# Patient Record
Sex: Female | Born: 1960 | Race: Black or African American | Hispanic: No | Marital: Single | Smoking: Never smoker
Health system: Southern US, Community
[De-identification: ages and names within clinical notes are randomized; demographics above are authoritative.]

## PROBLEM LIST (undated history)

## (undated) DIAGNOSIS — N289 Disorder of kidney and ureter, unspecified: Secondary | ICD-10-CM

## (undated) DIAGNOSIS — F329 Major depressive disorder, single episode, unspecified: Secondary | ICD-10-CM

## (undated) DIAGNOSIS — H539 Unspecified visual disturbance: Secondary | ICD-10-CM

## (undated) DIAGNOSIS — G35 Multiple sclerosis: Secondary | ICD-10-CM

## (undated) DIAGNOSIS — I10 Essential (primary) hypertension: Secondary | ICD-10-CM

## (undated) DIAGNOSIS — E785 Hyperlipidemia, unspecified: Secondary | ICD-10-CM

## (undated) DIAGNOSIS — R51 Headache: Secondary | ICD-10-CM

## (undated) DIAGNOSIS — F32A Depression, unspecified: Secondary | ICD-10-CM

## (undated) DIAGNOSIS — R519 Headache, unspecified: Secondary | ICD-10-CM

## (undated) HISTORY — DX: Major depressive disorder, single episode, unspecified: F32.9

## (undated) HISTORY — DX: Multiple sclerosis: G35

## (undated) HISTORY — DX: Hyperlipidemia, unspecified: E78.5

## (undated) HISTORY — DX: Headache, unspecified: R51.9

## (undated) HISTORY — DX: Unspecified visual disturbance: H53.9

## (undated) HISTORY — DX: Disorder of kidney and ureter, unspecified: N28.9

## (undated) HISTORY — DX: Essential (primary) hypertension: I10

## (undated) HISTORY — DX: Headache: R51

## (undated) HISTORY — DX: Depression, unspecified: F32.A

---

## 1998-10-24 ENCOUNTER — Emergency Department (HOSPITAL_COMMUNITY): Admission: EM | Admit: 1998-10-24 | Discharge: 1998-10-24 | Payer: Self-pay | Admitting: Emergency Medicine

## 1998-11-01 ENCOUNTER — Ambulatory Visit (HOSPITAL_COMMUNITY): Admission: RE | Admit: 1998-11-01 | Discharge: 1998-11-01 | Payer: Self-pay | Admitting: Family Medicine

## 1998-11-01 ENCOUNTER — Encounter: Payer: Self-pay | Admitting: Family Medicine

## 2000-03-07 ENCOUNTER — Other Ambulatory Visit: Admission: RE | Admit: 2000-03-07 | Discharge: 2000-03-07 | Payer: Self-pay | Admitting: *Deleted

## 2001-08-11 ENCOUNTER — Other Ambulatory Visit: Admission: RE | Admit: 2001-08-11 | Discharge: 2001-08-11 | Payer: Self-pay | Admitting: Obstetrics and Gynecology

## 2001-08-21 ENCOUNTER — Encounter: Payer: Self-pay | Admitting: Obstetrics and Gynecology

## 2001-08-21 ENCOUNTER — Ambulatory Visit (HOSPITAL_COMMUNITY): Admission: RE | Admit: 2001-08-21 | Discharge: 2001-08-21 | Payer: Self-pay | Admitting: Obstetrics and Gynecology

## 2002-08-17 ENCOUNTER — Other Ambulatory Visit: Admission: RE | Admit: 2002-08-17 | Discharge: 2002-08-17 | Payer: Self-pay | Admitting: Obstetrics and Gynecology

## 2002-08-26 ENCOUNTER — Ambulatory Visit (HOSPITAL_COMMUNITY): Admission: RE | Admit: 2002-08-26 | Discharge: 2002-08-26 | Payer: Self-pay | Admitting: Obstetrics and Gynecology

## 2002-08-26 ENCOUNTER — Encounter: Payer: Self-pay | Admitting: Obstetrics and Gynecology

## 2003-11-02 ENCOUNTER — Other Ambulatory Visit: Admission: RE | Admit: 2003-11-02 | Discharge: 2003-11-02 | Payer: Self-pay | Admitting: Obstetrics and Gynecology

## 2003-11-09 ENCOUNTER — Ambulatory Visit (HOSPITAL_COMMUNITY): Admission: RE | Admit: 2003-11-09 | Discharge: 2003-11-09 | Payer: Self-pay | Admitting: Family Medicine

## 2005-05-07 ENCOUNTER — Other Ambulatory Visit: Admission: RE | Admit: 2005-05-07 | Discharge: 2005-05-07 | Payer: Self-pay | Admitting: Obstetrics and Gynecology

## 2005-05-22 ENCOUNTER — Ambulatory Visit (HOSPITAL_COMMUNITY): Admission: RE | Admit: 2005-05-22 | Discharge: 2005-05-22 | Payer: Self-pay | Admitting: Obstetrics and Gynecology

## 2006-06-03 ENCOUNTER — Other Ambulatory Visit: Admission: RE | Admit: 2006-06-03 | Discharge: 2006-06-03 | Payer: Self-pay | Admitting: Obstetrics and Gynecology

## 2006-07-01 ENCOUNTER — Ambulatory Visit (HOSPITAL_COMMUNITY): Admission: RE | Admit: 2006-07-01 | Discharge: 2006-07-01 | Payer: Self-pay | Admitting: Obstetrics and Gynecology

## 2006-07-05 ENCOUNTER — Ambulatory Visit: Payer: Self-pay | Admitting: Internal Medicine

## 2006-07-18 ENCOUNTER — Ambulatory Visit: Payer: Self-pay | Admitting: Internal Medicine

## 2006-07-19 ENCOUNTER — Ambulatory Visit: Payer: Self-pay | Admitting: Internal Medicine

## 2006-09-09 ENCOUNTER — Ambulatory Visit: Payer: Self-pay | Admitting: Internal Medicine

## 2009-12-24 DIAGNOSIS — G35 Multiple sclerosis: Secondary | ICD-10-CM

## 2009-12-24 HISTORY — DX: Multiple sclerosis: G35

## 2009-12-25 ENCOUNTER — Ambulatory Visit (HOSPITAL_COMMUNITY): Admission: RE | Admit: 2009-12-25 | Discharge: 2009-12-25 | Payer: Self-pay | Admitting: Family Medicine

## 2011-05-11 NOTE — Assessment & Plan Note (Signed)
[facility]                               PULMONARY OFFICE NOTE   FINLAY, MILLS                        MRN:          [id number]  DATE:07/05/2006                            DOB:          [birth year]    CHIEF COMPLAINT:  Cough.   HISTORY OF PRESENT ILLNESS:  This is a 50 year old, black female, never a  smoker with a history of mild nasal congestion with tendency to sinus  headaches for which Zyrtec has helped some in the past.  She has been  acutely ill since May 16, with the onset of sore throat, fever, chest  discomfort during coughing and generalized headache.  She was treated with  antibiotics, prednisone and cough suppressants and still had the sensation  of something still stuck in her throat associated with an urge to clear  her throat, but no excessive sputum production.  She has nasal congestion,  but no purulent nasal secretions, itching, sneezing or overt reflux  symptoms.  She initially had one episode of vomiting during severe coughing  paroxysms and now has urinary urgency, but no incontinence during coughs.  She says the only thing that seems to help her cough now is heavy cough  suppressants which just put her to sleep.  She said she is maybe a little  better from Advair.   PAST MEDICAL HISTORY:  1.  Hypertension for which she is not on ACE inhibitors.  2.  Hyperlipidemia.  3.  Morbid obesity.   ALLERGIES:  NO KNOWN DRUG ALLERGIES.   MEDICATIONS:  Recorded in detail on July 05, 2006.   SOCIAL HISTORY:  She has never smoked.  She works for a group home.   FAMILY HISTORY:  Significant for allergies and asthma, negative for other  respiratory diseases.   REVIEW OF SYSTEMS:  Taken in detail and significant for the above problems.   PHYSICAL EXAMINATION:  GENERAL:  This is a somber, but no overtly depressed,  slightly anxious, black female in acute distress.  VITAL SIGNS:  Blood pressure 160/100, weight 247 pounds.  HEENT:   Severe edema to the point that she has total obstruction of both  nasal passages.  There are no purulent secretions or polyps noted.  Oropharynx is clear with no excessive post nasal drainage.  NECK:  Supple without cervical adenopathy.  No lymphadenopathy.  LUNGS:  Clear to auscultation.  CARDIAC:  Regular rhythm without murmurs, rubs or gallops.  ABDOMEN:  Soft, benign with no palpable organomegaly or masses and  tenderness.  EXTREMITIES:  Warm without calf tenderness, cyanosis or clubbing.   LABORATORY DATA AND X-RAY FINDINGS:  Chest x-ray report reviewed from  [facility] dated June 14, 2006, is normal except for mild cardiomegaly.   IMPRESSION:  Severe upper airway coughing in a setting of recent upper  respiratory infection with marginal response to Advair and best response to  cough suppressants.  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  Note,  the absence of response to prednisone strongly rules against an allergic  mechanism.   RECOMMENDATIONS:  1.  Eliminate the cough inducing cough cycle associated with reflux by      asking her to take Protonix preferably before meals for the next 2 weeks      and also a reflux diet.  2.  Address the underlying problem of chronic rhinitis with nasal steroids      in the form of nasal spray two puffs b.i.d. with Afrin for the first 5      days only.  3.  To eliminate cyclical coughing, we recommended Delsym supplemented with      Tramadol 50 mg one every 4 hours and went over the physiology of both      rhinitis induced cough and the central role reflux typically plays in      the syndrome to help her understand that this is a complex area of      medicine where there may not be one identifiable cause driving the      cough.  4.  If she is not better by the time she returns in 2 weeks, I  would to do a      sinus CT scan.  5.  At this point, I think Advair, although it may have helped somewhat with      the lower airway, seems to be irritating the upper airway and I am going      to ask her to stop it.                                   Charlaine Dalton. Sherene Sires, MD, [facility]   MBW/MedQ  DD:  07/05/2006  DT:  07/05/2006  Job #:  [id number]   cc:   Deatra James, MD

## 2011-05-11 NOTE — Assessment & Plan Note (Signed)
[facility]                               PULMONARY OFFICE NOTE   Joanne, Parker                        MRN:          [id number]  DATE:07/19/2006                            DOB:          [birth year]    HISTORY:  A 50 year old black female with refractory cough dating back to  mid-May 2007, somewhat better (she ranks it as maybe 40-50%) after  treatment directed at reflux with Protonix 40 mg q.a.m. and also at rhinitis  with Nasacort 2 puffs b.i.d.  I asked her stop Advair because I thought it  might be irritating her airway more than helping the lower airway as well.  She produces minimal mucoid sputum.  Denies any nocturnal exacerbations at  this point or significant dyspnea, chest pain, overt rhinitis, or reflux  symptoms.   For full inventory of medications, please see face sheet dated July 19, 2006, noting the patient is on Ziac at a dose of 10 mg b.i.d.   PHYSICAL EXAMINATION:  GENERAL:  She is a pleasant, ambulatory, black female  in no acute distress.  VITAL SIGNS:  Blood pressure 148/86.  HEENT:  Unremarkable.  Pharynx clear.  There is no excessive post nasal  drainage, cobblestoning.  NECK:  Supple without cervical adenopathy or tenderness.  Trachea is  midline.  LUNGS:  Fields are perfectly clear bilaterally on auscultation percussion  with no cough elicited on inspiratory/expiratory maneuvers.  HEART:  There is a regular rate and rhythm without murmur, gallop or rub  present.  ABDOMEN:  Soft, benign.  EXTREMITIES:  Warm without calf tenderness, cyanosis, clubbing, or edema.   Sinus CT scan was reviewed with the patient from July 18, 2006, and is  normal.   IMPRESSION:  Severe upper airway coughing in the setting of apparent upper  respiratory infection in May with no convincing response to Advair and  actually better since she stopped it while being treated for rhinitis and  reflux.   The fact that the turbinates are so  normal on CT scan leads me away from a  diagnosis of rhinitis being the mechanism for cough and toward the  possibility that this is all reflux, especially given the patient's body  habitus.   She also may have a component of asthma and is on very high doses of beta-  blockers which could be problematic in terms of controlling the cough.   1.  therefore, recommend the following strategy:      1.  Try reducing the dose of Ziac to 10 mg q.a.m. and adding Sular 20 mg          every day to her regimen (__________  worsening reflux symptoms          since it is a calcium channel blocker and I would have a low          threshold to switch back to Ziac or add a ARB to Ziac to reduce the          beta-blocker component for antihypertensive therapy).      2.  Continue to use Nasacort consistently.      3.  Increase Protonix empirically to 4 mg b.i.d.      4.  Suppress cyclical coughing with a combination of Delsym 2 teaspoons          q.12 h. plus Tramadol 50 mg q.4 h. (She misunderstood the          instructions the first time.  So, I spent extra time with her.  In          fact 15-25 minute visit, going line by line over the recommendation          to make sure she understood both maintenance and p.r.n.          recommendations).   I am encouraged that she is at least 40-50% better after reporting no  improvement previously and hope that we can arrive at an empiric diagnosis  without additional studies.  However, a methacholine challenge test may need  to be considered if she continues to have refractory cough as well as  perhaps a trial of pro motility therapy in the form of Reglan.                                   Charlaine Dalton. Sherene Sires, MD, [facility]   MBW/MedQ  DD:  07/19/2006  DT:  07/20/2006  Job #:  [id number]   cc:   Stacie Acres. Cliffton Asters, MD

## 2011-05-11 NOTE — Assessment & Plan Note (Signed)
[facility]                               PULMONARY OFFICE NOTE   [name], [name]                        MRN:          [id number]  DATE:09/09/2006                            DOB:          02/23/1961    HISTORY:  A 50 year old black female with chronic cough that seemed better  while being treated aggressively for rhinitis and reflux, and comes back  today for refills after missing her followup appointment on August 22.  She  denies any exacerbation of coughing or need for cough suppressants over the  last several weeks.   PHYSICAL EXAMINATION:  She is a pleasant ambulatory black female in no acute  distress.  She weighs 238 pounds, which is no change in baseline.  Otherwise, vital signs are unremarkable.  Her blood pressure is 126/80.  HEENT:  Unremarkable, oropharynx is clear.  LUNGS:  Lung fields are clear bilaterally to auscultation and percussion.  CARDIAC:  Appears regular rhythm without murmur, gallop or rub.  ABDOMEN:  Soft, benign.  EXTREMITIES:  Warm without calf tenderness, cyanosis, clubbing or edema.   IMPRESSION:  Chronic cough, seems better with treatment directed at rhinitis  and reflux.  The reason I like to treat reflux aggressively is it is the  only cause of chronic cough that actually causes the other two major causes  (asthma and rhinitis).  Using this strategy, I recommended she try weaning  the Nasacort first, and then reducing the Protonix dose down to 1 daily.  If  at any point she exacerbates, she can certainly go back to treating both  rhinitis and reflux at the higher doses.   I reminded her that dietary measures are probably just as important as  taking medications, and that refills could be obtained through Dr. Cala Bradford offices.  I will need to see her if her cough is not adequately  controlled on the above regimen.                                   Joanne Parker. Joanne Sires, MD, FCCP   MBW/MedQ  DD:   09/10/2006  DT:  09/11/2006  Job #:  [id number]   cc:   Stacie Acres. Cliffton Asters, M.D.

## 2012-11-22 ENCOUNTER — Ambulatory Visit (INDEPENDENT_AMBULATORY_CARE_PROVIDER_SITE_OTHER): Payer: PRIVATE HEALTH INSURANCE | Admitting: Emergency Medicine

## 2012-11-22 VITALS — BP 145/81 | HR 77 | Temp 98.3°F | Resp 18 | Ht 64.0 in | Wt 234.0 lb

## 2012-11-22 DIAGNOSIS — I10 Essential (primary) hypertension: Secondary | ICD-10-CM

## 2012-11-22 DIAGNOSIS — E782 Mixed hyperlipidemia: Secondary | ICD-10-CM

## 2012-11-22 DIAGNOSIS — J069 Acute upper respiratory infection, unspecified: Secondary | ICD-10-CM

## 2012-11-22 MED ORDER — LISINOPRIL 10 MG PO TABS: 10.0000 mg | ORAL_TABLET | Freq: Every day | ORAL | Status: DC

## 2012-11-22 MED ORDER — CITALOPRAM HYDROBROMIDE 20 MG PO TABS: 20.0000 mg | ORAL_TABLET | Freq: Every day | ORAL | Status: DC

## 2012-11-22 MED ORDER — PSEUDOEPHEDRINE-GUAIFENESIN ER 60-600 MG PO TB12: 1.0000 | ORAL_TABLET | Freq: Two times a day (BID) | ORAL | Status: DC

## 2012-11-22 MED ORDER — PRAVASTATIN SODIUM 40 MG PO TABS: 40.0000 mg | ORAL_TABLET | Freq: Every day | ORAL | Status: DC

## 2012-11-22 MED ORDER — BISOPROLOL-HYDROCHLOROTHIAZIDE 10-6.25 MG PO TABS: 2.0000 | ORAL_TABLET | Freq: Every day | ORAL | Status: AC

## 2012-11-22 NOTE — Progress Notes (Signed)
[facility] [address], [place] [address] [phone]  Date:  11/22/2012   Name:  Joanne Parker   DOB:  05/10/1961   MRN:  [id number]  PCP:  No primary provider on file.    Chief Complaint: sinus congestion, Cough and Fatigue   History of Present Illness:  Joanne Parker is a 51 y.o. very pleasant female patient who presents with the following:  Nasal congestion and drainage that is clear mucoid.  Has moderate post nasal drainage.  Non productive cough.  No wheezing or shortness of breath.  No sore throat.  No fever or chills.  Says that she feels hot and that is related to her MS.  Denies nausea or vomiting.  Needs refills on meds.  No labs since over a year ago.  There is no problem list on file for this patient.   Past Medical History  Diagnosis Date  . Depression   . Hypertension   . MS (multiple sclerosis) 2011    History reviewed. No pertinent past surgical history.  History  Substance Use Topics  . Smoking status: Never Smoker   . Smokeless tobacco: Never Used  . Alcohol Use: No    Family History  Problem Relation Age of Onset  . Hypertension Mother   . Hypertension Father   . Stroke Father   . Heart disease Maternal Grandmother   . Cancer Maternal Grandfather   . Heart disease Paternal Grandmother   . Asthma Paternal Grandfather     No Known Allergies  Medication list has been reviewed and updated.  Current Outpatient Prescriptions on File Prior to Visit  Medication Sig Dispense Refill  . citalopram (CELEXA) 20 MG tablet Take 20 mg by mouth daily.      . interferon beta-1a (REBIF REBIDOSE) 44 MCG/0.5ML injection Inject 44 mcg into the skin 3 (three) times a week.      Marland Kitchen lisinopril (PRINIVIL,ZESTRIL) 10 MG tablet Take 10 mg by mouth daily. Takes 2 tabs daily      . pravastatin (PRAVACHOL) 40 MG tablet Take 40 mg by mouth daily. Takes two tablets daily        Review of Systems:  As per HPI, otherwise negative.     Physical Examination: Filed Vitals:   11/22/12 1722  BP: 145/81  Pulse: 77  Temp: 98.3 F (36.8 C)  Resp: 18   Filed Vitals:   11/22/12 1722  Height: 5\' 4"  (1.626 m)  Weight: 234 lb (106.142 kg)   Body mass index is 40.17 kg/(m^2). Ideal Body Weight: Weight in (lb) to have BMI = 25: 145.3   GEN: WDWN, NAD, Non-toxic, A & O x 3 HEENT: Atraumatic, Normocephalic. Neck supple. No masses, No LAD. Ears and Nose: No external deformity. CV: RRR, No M/G/R. No JVD. No thrill. No extra heart sounds. PULM: CTA B, no wheezes, crackles, rhonchi. No retractions. No resp. distress. No accessory muscle use. ABD: S, NT, ND, +BS. No rebound. No HSM. EXTR: No c/c/e NEURO Normal gait.  PSYCH: Normally interactive. Conversant. Not depressed or anxious appearing.  Calm demeanor.    Assessment and Plan: Reviewed old records Scheduled labs Schedule in 104 with Dr Audria Nine URI mucinex D Refills for a month  Carmelina Dane, MD

## 2012-11-24 ENCOUNTER — Other Ambulatory Visit: Payer: Self-pay | Admitting: *Deleted

## 2012-11-24 MED ORDER — PRAVASTATIN SODIUM 40 MG PO TABS: 40.0000 mg | ORAL_TABLET | Freq: Every day | ORAL | Status: DC

## 2012-11-24 MED ORDER — LISINOPRIL 10 MG PO TABS: 10.0000 mg | ORAL_TABLET | Freq: Every day | ORAL | Status: DC

## 2012-12-01 NOTE — Progress Notes (Signed)
Reviewed and agree.

## 2013-01-01 ENCOUNTER — Ambulatory Visit (INDEPENDENT_AMBULATORY_CARE_PROVIDER_SITE_OTHER): Payer: PRIVATE HEALTH INSURANCE | Admitting: Physician Assistant

## 2013-01-01 VITALS — BP 118/67 | HR 74 | Temp 98.9°F | Resp 16 | Ht 62.5 in | Wt 229.0 lb

## 2013-01-01 DIAGNOSIS — E785 Hyperlipidemia, unspecified: Secondary | ICD-10-CM | POA: Insufficient documentation

## 2013-01-01 DIAGNOSIS — J101 Influenza due to other identified influenza virus with other respiratory manifestations: Secondary | ICD-10-CM

## 2013-01-01 DIAGNOSIS — F329 Major depressive disorder, single episode, unspecified: Secondary | ICD-10-CM | POA: Insufficient documentation

## 2013-01-01 DIAGNOSIS — G35 Multiple sclerosis: Secondary | ICD-10-CM | POA: Insufficient documentation

## 2013-01-01 DIAGNOSIS — F32A Depression, unspecified: Secondary | ICD-10-CM | POA: Insufficient documentation

## 2013-01-01 DIAGNOSIS — I10 Essential (primary) hypertension: Secondary | ICD-10-CM | POA: Insufficient documentation

## 2013-01-01 DIAGNOSIS — R059 Cough, unspecified: Secondary | ICD-10-CM

## 2013-01-01 DIAGNOSIS — J111 Influenza due to unidentified influenza virus with other respiratory manifestations: Secondary | ICD-10-CM

## 2013-01-01 DIAGNOSIS — R51 Headache: Secondary | ICD-10-CM

## 2013-01-01 DIAGNOSIS — R519 Headache, unspecified: Secondary | ICD-10-CM

## 2013-01-01 DIAGNOSIS — R05 Cough: Secondary | ICD-10-CM

## 2013-01-01 LAB — POCT INFLUENZA A/B
Influenza A, POC: POSITIVE
Influenza B, POC: NEGATIVE

## 2013-01-01 MED ORDER — HYDROCOD POLST-CHLORPHEN POLST 10-8 MG/5ML PO LQCR: 5.0000 mL | Freq: Two times a day (BID) | ORAL | Status: DC | PRN

## 2013-01-01 MED ORDER — IPRATROPIUM BROMIDE 0.03 % NA SOLN: 2.0000 | Freq: Two times a day (BID) | NASAL | Status: DC

## 2013-01-01 MED ORDER — OSELTAMIVIR PHOSPHATE 75 MG PO CAPS: 75.0000 mg | ORAL_CAPSULE | Freq: Two times a day (BID) | ORAL | Status: DC

## 2013-01-01 MED ORDER — GUAIFENESIN ER 1200 MG PO TB12: 1.0000 | ORAL_TABLET | Freq: Two times a day (BID) | ORAL | Status: DC | PRN

## 2013-01-01 NOTE — Progress Notes (Signed)
Subjective:    Patient ID: Joanne Parker, female    DOB: 1961-05-28, 52 y.o.   MRN: [id number]  HPI This 52 y.o. female presents for evaluation of illness x 2 days.  Symptoms began with HA, then developed cough which kept her awake last night.  Used cough drops all day today.  No achiness.  Sore throat.  No ear pain.  No GU symptoms.  No GI symptoms. No flu vaccine this season.  Past Medical History  Diagnosis Date  . Depression   . Hypertension   . MS (multiple sclerosis) 2011  . Hyperlipidemia     History reviewed. No pertinent past surgical history.  Prior to Admission medications   Medication Sig Start Date End Date Taking? Authorizing Provider  bisoprolol-hydrochlorothiazide (ZIAC) 10-6.25 MG per tablet Take 2 tablets by mouth daily. 11/22/12  Yes Phillips Odor, MD  citalopram (CELEXA) 20 MG tablet Take 1 tablet (20 mg total) by mouth daily. 11/22/12  Yes Phillips Odor, MD  interferon beta-1a (REBIF REBIDOSE) 44 MCG/0.5ML injection Inject 44 mcg into the skin 3 (three) times a week.   Yes Historical Provider, MD  lisinopril (PRINIVIL,ZESTRIL) 10 MG tablet Take 1 tablet (10 mg total) by mouth daily. 11/24/12  Yes Phillips Odor, MD  pravastatin (PRAVACHOL) 40 MG tablet Take 1 tablet (40 mg total) by mouth daily. 11/24/12  Yes Phillips Odor, MD    No Known Allergies  History   Social History  . Marital Status: Single    Spouse Name: N/A    Number of Children: 0  . 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 Smoking status: Never Smoker   . Smokeless tobacco: Never Used  . Alcohol Use: No  . Drug Use: No  . Sexually Active: Yes -- Female partner(s)    Birth Control/ Protection: Condom   Other Topics Concern  . Not on file   Social History Narrative   Lives alone.    Family History  Problem Relation Age of Onset  . Hypertension Mother   . Hypertension Father   . Stroke Father     . Diabetes Father   . Heart disease Maternal Grandmother   . Cancer Maternal Grandfather   . Heart disease Paternal Grandmother   . Asthma Paternal Grandfather      Review of Systems As above.    Objective:   Physical Exam Blood pressure 118/67, pulse 74, temperature 98.9 F (37.2 C), resp. rate 16, height 5' 2.5" (1.588 m), weight 229 lb (103.874 kg), last menstrual period 12/16/2012, SpO2 100.00%. Body mass index is 41.22 kg/(m^2). Well-developed, well nourished BF who is awake, alert and oriented, in moderate distress with cough. HEENT: [place]/AT, PERRL, EOMI.  Sclera and conjunctiva are clear.  EAC are patent, TMs are normal in appearance. Nasal mucosa is pink and moist. OP is clear. Neck: supple, non-tender, no lymphadenopathy, thyromegaly. Heart: RRR, no murmur Lungs: normal effort, CTA Extremities: no cyanosis, clubbing or edema. Skin: warm and dry without rash. Psychologic: good mood and appropriate affect, normal speech and behavior.   Results for orders placed in visit on 01/01/13  POCT INFLUENZA A/B      Component Value Range   Influenza A, POC Positive     Influenza B, POC           Assessment & Plan:   1. Cough  POCT Influenza A/B, chlorpheniramine-HYDROcodone (TUSSIONEX PENNKINETIC ER) 10-8  MG/5ML LQCR, Guaifenesin (MUCINEX MAXIMUM STRENGTH) 1200 MG TB12  2. HA (headache)    3. Influenza A  oseltamivir (TAMIFLU) 75 MG capsule, ipratropium (ATROVENT) 0.03 % nasal spray   Anticipatory guidance.  Supportive care.  RTW 01/07/2013, sooner if symptoms completely resolved.

## 2013-01-01 NOTE — Patient Instructions (Signed)
Influenza Facts  Flu (influenza) is a contagious respiratory illness caused by the influenza viruses. It can cause mild to severe illness. While most healthy people recover from the flu without specific treatment and without complications, older people, young children, and people with certain health conditions are at higher risk for serious complications from the flu, including death.  CAUSES    The flu virus is spread from person to person by respiratory droplets from coughing and sneezing.   A person can also become infected by touching an object or surface with a virus on it and then touching their mouth, eye or nose.   Adults may be able to infect others from 1 day before symptoms occur and up to 7 days after getting sick. So it is possible to give someone the flu even before you know you are sick and continue to infect others while you are sick.  SYMPTOMS    Fever (usually high).   Headache.   Tiredness (can be extreme).   Cough.   Sore throat.   Runny or stuffy nose.   Body aches.   Diarrhea and vomiting may also occur, particularly in children.   These symptoms are referred to as "flu-like symptoms". A lot of different illnesses, including the common cold, can have similar symptoms.  DIAGNOSIS    There are tests that can determine if you have the flu as long you are tested within the first 2 or 3 days of illness.   A doctor's exam and additional tests may be needed to identify if you have a disease that is a complicating the flu.  RISKS AND COMPLICATIONS   Some of the complications caused by the flu include:   Bacterial pneumonia or progressive pneumonia caused by the flu virus.   Loss of body fluids (dehydration).   Worsening of chronic medical conditions, such as heart failure, asthma, or diabetes.   Sinus problems and ear infections.  HOME CARE INSTRUCTIONS    Seek medical care early on.   If you are at high risk from complications of the flu, consult your health-care provider as soon  as you develop flu-like symptoms. Those at high risk for complications include:   People 65 years or older.   People with chronic medical conditions, including diabetes.   Pregnant women.   Young children.   Your caregiver may recommend use of an antiviral medication to help treat the flu.   If you get the flu, get plenty of rest, drink a lot of liquids, and avoid using alcohol and tobacco.   You can take over-the-counter medications to relieve the symptoms of the flu if your caregiver approves. (Never give aspirin to children or teenagers who have flu-like symptoms, particularly fever).  PREVENTION   The single best way to prevent the flu is to get a flu vaccine each fall. Other measures that can help protect against the flu are:   Antiviral Medications   A number of antiviral drugs are approved for use in preventing the flu. These are prescription medications, and a doctor should be consulted before they are used.   Habits for Good Health   Cover your nose and mouth with a tissue when you cough or sneeze, throw the tissue away after you use it.   Wash your hands often with soap and water, especially after you cough or sneeze. If you are not near water, use an alcohol-based hand cleaner.   Avoid people who are sick.   If you get the   flu, stay home from work or school. Avoid contact with other people so that you do not make them sick, too.   Try not to touch your eyes, nose, or mouth as germs ore often spread this way.  IN CHILDREN, EMERGENCY WARNING SIGNS THAT NEED URGENT MEDICAL ATTENTION:   Fast breathing or trouble breathing.   Bluish skin color.   Not drinking enough fluids.   Not waking up or not interacting.   Being so irritable that the child does not want to be held.   Flu-like symptoms improve but then return with fever and worse cough.   Fever with a rash.  IN ADULTS, EMERGENCY WARNING SIGNS THAT NEED URGENT MEDICAL ATTENTION:   Difficulty breathing or shortness of breath.   Pain  or pressure in the chest or abdomen.   Sudden dizziness.   Confusion.   Severe or persistent vomiting.  SEEK IMMEDIATE MEDICAL CARE IF:   You or someone you know is experiencing any of the symptoms above. When you arrive at the emergency center,report that you think you have the flu. You may be asked to wear a mask and/or sit in a secluded area to protect others from getting sick.  MAKE SURE YOU:    Understand these instructions.   Monitor your condition.   Seek medical care if you are getting worse, or not improving.  Document Released: 12/13/2003 Document Revised: 03/03/2012 Document Reviewed: 09/08/2009  [employer] Patient Information 2013 [employer]

## 2013-11-24 DIAGNOSIS — Z0271 Encounter for disability determination: Secondary | ICD-10-CM

## 2014-02-11 ENCOUNTER — Telehealth: Payer: Self-pay | Admitting: Neurology

## 2015-02-10 ENCOUNTER — Ambulatory Visit (INDEPENDENT_AMBULATORY_CARE_PROVIDER_SITE_OTHER): Payer: BLUE CROSS/BLUE SHIELD | Admitting: Neurology

## 2015-02-10 ENCOUNTER — Encounter: Payer: Self-pay | Admitting: Neurology

## 2015-02-10 VITALS — BP 138/78 | HR 68 | Resp 16 | Ht 64.0 in | Wt 237.6 lb

## 2015-02-10 DIAGNOSIS — R4189 Other symptoms and signs involving cognitive functions and awareness: Secondary | ICD-10-CM | POA: Insufficient documentation

## 2015-02-10 DIAGNOSIS — F32A Depression, unspecified: Secondary | ICD-10-CM

## 2015-02-10 DIAGNOSIS — R269 Unspecified abnormalities of gait and mobility: Secondary | ICD-10-CM | POA: Insufficient documentation

## 2015-02-10 DIAGNOSIS — H469 Unspecified optic neuritis: Secondary | ICD-10-CM | POA: Insufficient documentation

## 2015-02-10 DIAGNOSIS — R260 Ataxic gait: Secondary | ICD-10-CM

## 2015-02-10 DIAGNOSIS — R5383 Other fatigue: Secondary | ICD-10-CM | POA: Insufficient documentation

## 2015-02-10 DIAGNOSIS — F329 Major depressive disorder, single episode, unspecified: Secondary | ICD-10-CM

## 2015-02-10 DIAGNOSIS — R350 Frequency of micturition: Secondary | ICD-10-CM | POA: Insufficient documentation

## 2015-02-10 DIAGNOSIS — G35 Multiple sclerosis: Secondary | ICD-10-CM

## 2015-02-10 MED ORDER — FLUOXETINE HCL 20 MG PO CAPS: 20.0000 mg | ORAL_CAPSULE | Freq: Every day | ORAL | Status: DC

## 2015-02-10 MED ORDER — LORAZEPAM 0.5 MG PO TABS: 0.5000 mg | ORAL_TABLET | Freq: Three times a day (TID) | ORAL | Status: DC

## 2015-02-10 MED ORDER — PHENTERMINE HCL 37.5 MG PO CAPS: 37.5000 mg | ORAL_CAPSULE | ORAL | Status: DC

## 2015-02-10 NOTE — Progress Notes (Signed)
[facility]  PATIENT: Joanne Parker DOB: 07-Jun-1961  REFERRING DOCTOR OR PCP:  Garner Nash SOURCE: Patient  _________________________________   HISTORICAL  CHIEF COMPLAINT:  Chief Complaint  Patient presents with  . Multiple Sclerosis    Sts. she tolerates Rebif well.  Sts. depression is some worse over the last 2-3 weeks.  She is not currently on an antidepressant currently.  She has seen a urologist in [facility] for urinary incontinence.  Oxybutynin was d/c and she was started on Vesicare and sts. this is working well./fim    HISTORY OF PRESENT ILLNESS:  Joanne Parker is a 54 year old woman who began to experience difficulties with her gait in December 2010. She felt the onset of gait disorder was fairly sudden. She noted bilateral leg clumsiness also noted that her handwriting was worse (right-handed). An MRI of the brain was performed on 02/12/2010 showed multiple white matter foci including one enhancing lesion in the left posterior limb of the internal capsule. Lesions were infratentorial and supratentorial with many in the periventricular white matter. Of note, in 2009 she had noted numbness in one of her legs that lasted for 2 or 3 weeks after she was placed on a muscle relaxant. I saw her on 12/28/2009 and felt the MRI was very consistent with MS. She received a 3 day course of IV steroids and was feeling better by the third day. She chose to start Rebif and tolerated it fairly well when she was reevaluated 6 weeks later. Vitamin D was checked and it was very low at 11.4 and she was supplemented. She was placed on oxybutynin for her bladder frequency and citalopram for depression. Repeat MRI's of the brain 01/2011 and 10/13/2012 showed no new foci.  She has many symptoms related to her multiple sclerosis including an ataxic gait, right greater than left vision changes, bladder dysfunction, fatigue, and a mood disturbance.  She has had some difficulty with  her gait. There are very rare falls but she does stumble more frequently. She denies numbness or weakness in the legs but feels clumsy and off balance. At times she will get some cramps in her legs.  She has a history of right greater than left vision changes that persists. She denies any change in color vision. There is no eye change and no eye infections.  She has had problems with urinary frequency and urgency. She was placed on oxybutynin with mild benefit. She was switched to Vesicare with more benefit but she may be having some issues with insurance getting refills.  She notes a lot of fatigue and often feels she does not have enough energy to do anything. She tries to exercise and often is able to make it to the gym 3 times a week and she is unable to exercise more. She notes that she has some insomnia. She usually falls asleep easily but if she wakes up she has a lot of difficulty falling back asleep because she gets anxiety thinking about her financial situation. She snores loudly but has not been noted to have changes in her breathing pattern at night.  She notes some cognitive dysfunction. She has the most difficulty spelling words that she knows that she should know how to spell. He has some trouble with short-term memory and with word finding. Focus is more difficult  She reports that she has had more depression with anxiety lately. Had some stress with the recent death of her grandfather., Her her car broke down and  she is unable to afford to get a new one. She used to be on citalopram 20 mg by mouth daily but is uncertain where the not she received any benefit from it. She occasionally has more anxiety that can be triggered by events like driving in a construction area or at night.    REVIEW OF SYSTEMS: Constitutional: No fevers, chills, sweats, or change in appetite Eyes: No visual changes, double vision, eye pain Ear, nose and throat: No hearing loss, ear pain, nasal congestion,  sore throat Cardiovascular: No chest pain, palpitations Respiratory: No shortness of breath at rest or with exertion.   No wheezes.  She snores GastrointestinaI: No nausea, vomiting, diarrhea, abdominal pain, fecal incontinence Genitourinary: No dysuria, urinary retention or frequency.  No nocturia. Musculoskeletal: No neck pain, back pain Integumentary: No rash, pruritus, skin lesions Neurological: as above.    Has occipital headaches at times, worse when has busier day.    Psychiatric: Notes depressionand anxiety Endocrine: No palpitations, diaphoresis, change in appetite, change in weigh or increased thirst Hematologic/Lymphatic: No anemia, purpura, petechiae. Allergic/Immunologic: No itchy/runny eyes, nasal congestion, recent allergic reactions, rashes  ALLERGIES: No Known Allergies  HOME MEDICATIONS:  Current outpatient prescriptions:  .  bisoprolol-hydrochlorothiazide (ZIAC) 10-6.25 MG per tablet, Take 2 tablets by mouth daily., Disp: 60 tablet, Rfl: 5 .  interferon beta-1a (REBIF REBIDOSE) 44 MCG/0.5ML injection, Inject 44 mcg into the skin 3 (three) times a week., Disp: , Rfl:  .  ipratropium (ATROVENT) 0.03 % nasal spray, Place 2 sprays into the nose 2 (two) times daily., Disp: 30 mL, Rfl: 0 .  levothyroxine (SYNTHROID, LEVOTHROID) 25 MCG tablet, Take 25 mcg by mouth daily before breakfast., Disp: , Rfl:  .  lisinopril (PRINIVIL,ZESTRIL) 10 MG tablet, Take 1 tablet (10 mg total) by mouth daily., Disp: 30 tablet, Rfl: 0 .  solifenacin (VESICARE) 5 MG tablet, Take 5 mg by mouth daily., Disp: , Rfl:  .  chlorpheniramine-HYDROcodone (TUSSIONEX PENNKINETIC ER) 10-8 MG/5ML LQCR, Take 5 mLs by mouth every 12 (twelve) hours as needed (cough). (Patient not taking: Reported on 02/10/2015), Disp: 140 mL, Rfl: 0 .  citalopram (CELEXA) 20 MG tablet, Take 1 tablet (20 mg total) by mouth daily. (Patient not taking: Reported on 02/10/2015), Disp: 30 tablet, Rfl: 5 .  Guaifenesin (MUCINEX  MAXIMUM STRENGTH) 1200 MG TB12, Take 1 tablet (1,200 mg total) by mouth every 12 (twelve) hours as needed. (Patient not taking: Reported on 02/10/2015), Disp: 14 tablet, Rfl: 1 .  oseltamivir (TAMIFLU) 75 MG capsule, Take 1 capsule (75 mg total) by mouth 2 (two) times daily. (Patient not taking: Reported on 02/10/2015), Disp: 10 capsule, Rfl: 0 .  pravastatin (PRAVACHOL) 40 MG tablet, Take 1 tablet (40 mg total) by mouth daily. (Patient not taking: Reported on 02/10/2015), Disp: 30 tablet, Rfl: 0  PAST MEDICAL HISTORY: Past Medical History  Diagnosis Date  . Depression   . Hypertension   . MS (multiple sclerosis) 2011  . Hyperlipidemia   . Headache   . Vision abnormalities     PAST SURGICAL HISTORY: History reviewed. No pertinent past surgical history.  FAMILY HISTORY: Family History  Problem Relation Age of Onset  . Hypertension Mother   . Hypertension Father   . Stroke Father   . Diabetes Father   . Heart disease Maternal Grandmother   . Cancer Maternal Grandfather   . Heart disease Paternal Grandmother   . Asthma Paternal Grandfather     SOCIAL HISTORY:  History   Social History  .  Marital Status: Single    Spouse Name: N/A  . Number of Children: 0  . 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 Smoking status: Never Smoker   . Smokeless tobacco: Never Used  . Alcohol Use: No  . Drug Use: No  . Sexual Activity:    Partners: Male    Birth Control/ Protection: Condom   Other Topics Concern  . Not on file   Social History Narrative   Lives alone.     PHYSICAL EXAM  Filed Vitals:   02/10/15 1512  BP: 138/78  Pulse: 68  Resp: 16  Height: 5\' 4"  (1.626 m)  Weight: 237 lb 9.6 oz (107.775 kg)    Body mass index is 40.76 kg/(m^2).   General: The patient is well-developed and well-nourished and in no acute distress  Eyes:  Funduscopic exam shows mild right optic disc pallor and  normal retinal vessels.  Neck: The neck is supple, no carotid bruits are noted.  The neck is nontender.  Cardiovascular: The heart has a regular rate and rhythm with a normal S1 and S2. There were no murmurs, gallops or rubs. Lungs are clear to auscultation.  Skin: Extremities are without significant edema.  Musculoskeletal:  Back is nontender  Neurologic Exam  Mental status: The patient is alert and oriented x 3 at the time of the examination. The patient has apparent normal recent and remote memory, with an apparently normal attention span and concentration ability.   Speech is normal.  Cranial nerves: Extraocular movements are full. Pupils  Show 1+ right APD.  Decreased color vision OD.  Visual fields are full.  Facial symmetry is present. There is good facial sensation to soft touch bilaterally.Facial strength is normal.  Trapezius and sternocleidomastoid strength is normal. No dysarthria is noted.  The tongue is midline, and the patient has symmetric elevation of the soft palate. No obvious hearing deficits are noted.  Motor:  Muscle bulk is normal.   Tone is increased in legs.. Strength is  5 / 5 in all 4 extremities.   Sensory: Sensory testing is intact to pinprick, soft touch and vibration sensation in all 4 extremities.  Coordination: Cerebellar testing reveals good finger-nose-finger and heel-to-shin bilaterally.  Gait and station: Station is normal.   Gait is slightly wode. Tandem gait is moderately wide. Romberg is negative.   Reflexes: Deep tendon reflexes are symmetric and normal bilaterally.   Plantar responses are flexor.    DIAGNOSTIC DATA (LABS, IMAGING, TESTING) - I reviewed patient records, labs, notes, testing and imaging myself where available.     ASSESSMENT AND PLAN  Multiple sclerosis  Depression  Optic neuritis  Urinary frequency  Other fatigue  Disturbed cognition  Ataxic gait   In summary, Joanne Parker is a 54 year old woman with multiple  sclerosis who is noting more issues with depression, fatigue and cognition. She has not had any definite exacerbations and last MRI did not show new changes.  Her depression, fatigue and cognitive changes probably old feet into each other and I will try to help the symptoms by having her start Prozac 20 mg daily area to help with fatigue and also help with weight loss I'll add phentermine. She appears to be stable on Rebif and will continue. She will continue on Vesicare for her bladder.  She will return to see me in 6 months if stable or call earlier if she  has new or worsening neurologic symptoms. We discussed trying an MRI later this year to ensure that she is not having subclinical progression that will make [place] reconsider changes in therapy.  40 minute face-to-face interaction with greater than 50% counseling or coordinating care about her multiple sclerosis.   Andrews Tener A. Epimenio Foot, MD, PhD 02/10/2015, 3:21 PM Certified in Neurology, Clinical Neurophysiology, Sleep Medicine, Pain Medicine and Neuroimaging  [facility] [address], [address] [place], [place] [address] [phone]

## 2015-08-11 ENCOUNTER — Ambulatory Visit (INDEPENDENT_AMBULATORY_CARE_PROVIDER_SITE_OTHER): Payer: BLUE CROSS/BLUE SHIELD | Admitting: Neurology

## 2015-08-11 ENCOUNTER — Encounter: Payer: Self-pay | Admitting: Neurology

## 2015-08-11 ENCOUNTER — Ambulatory Visit: Payer: BLUE CROSS/BLUE SHIELD | Admitting: Neurology

## 2015-08-11 VITALS — BP 148/82 | HR 62 | Resp 20 | Ht 64.0 in | Wt 244.0 lb

## 2015-08-11 DIAGNOSIS — F329 Major depressive disorder, single episode, unspecified: Secondary | ICD-10-CM

## 2015-08-11 DIAGNOSIS — R260 Ataxic gait: Secondary | ICD-10-CM | POA: Diagnosis not present

## 2015-08-11 DIAGNOSIS — R5383 Other fatigue: Secondary | ICD-10-CM | POA: Diagnosis not present

## 2015-08-11 DIAGNOSIS — R4189 Other symptoms and signs involving cognitive functions and awareness: Secondary | ICD-10-CM | POA: Diagnosis not present

## 2015-08-11 DIAGNOSIS — G4719 Other hypersomnia: Secondary | ICD-10-CM | POA: Diagnosis not present

## 2015-08-11 DIAGNOSIS — F32A Depression, unspecified: Secondary | ICD-10-CM

## 2015-08-11 DIAGNOSIS — R350 Frequency of micturition: Secondary | ICD-10-CM | POA: Diagnosis not present

## 2015-08-11 DIAGNOSIS — R0683 Snoring: Secondary | ICD-10-CM | POA: Diagnosis not present

## 2015-08-11 DIAGNOSIS — G35 Multiple sclerosis: Secondary | ICD-10-CM

## 2015-08-11 MED ORDER — TRAMADOL HCL 50 MG PO TABS: 50.0000 mg | ORAL_TABLET | Freq: Four times a day (QID) | ORAL | Status: DC | PRN

## 2015-08-11 MED ORDER — PHENTERMINE HCL 37.5 MG PO CAPS: 37.5000 mg | ORAL_CAPSULE | ORAL | Status: DC

## 2015-08-11 NOTE — Progress Notes (Signed)
[facility]  PATIENT: Joanne Parker DOB: 1961-10-24  REFERRING DOCTOR OR PCP:  Garner Nash SOURCE: Patient  _________________________________   HISTORICAL  CHIEF COMPLAINT:  Chief Complaint  Patient presents with  . Multiple Sclerosis    Sts. she is tolerating Rebif well.  Sts. depression is some better with Prozac but still present.  Sts. her pcp increased Prozac from 20mg  daily to 40mg  daily 3 mos. ago.  Sts. she stopped Phentermine but would like to restart this--needs a new rx/fim    HISTORY OF PRESENT ILLNESS:  Joanne Parker is a 54 year old woman with MS diagnosed in 2011.    She is on Rebif and feels she tolerates it well.    Earlier in the summer she had 1-2  weeks where she felt her balance was worse and the left arm felt more clumsy.    Otherwise, she has been stable the past 6 months.     MS History:   She began to experience difficulties with her gait in December 2010. She felt the onset of gait disorder was fairly sudden. She noted bilateral leg clumsiness also noted that her handwriting was worse (right-handed). An MRI of the brain was performed on 02/12/2010 showed multiple white matter foci including one enhancing lesion in the left posterior limb of the internal capsule. Lesions were infratentorial and supratentorial with many in the periventricular white matter. Of note, in 2009 she had noted numbness in one of her legs that lasted for 2 or 3 weeks after she was placed on a muscle relaxant. I saw her on 12/28/2009 and felt the MRI was very consistent with MS. She received a 3 day course of IV steroids and was feeling better by the third day. She chose to start Rebif and tolerated it fairly well when she was reevaluated 6 weeks later.   Repeat MRI's of the brain 01/2011 and 10/13/2012 showed no new foci.  Gait/strength/sensation:  She has some difficulty with her gait and balance. She stumbles but has only fallen a few times.    She denies numbness or  weakness in the legs but feels clumsy and off balance. She notes some spasticity in legs.  Vision:   She notes mild right eye vision changes. She denies any change in color vision. There is no eye change and no eye infections.  Bladder:    She reports urinary frequency and urgency. She was placed on oxybutynin with minimal benefit. She was switched to Vesicare with some benefit but her copay was very high and her PCP took her off.  She has 2-4 times nocturia  Fatigue/sleep:    She notes fatigue and often feels she does not have enough energy to do anything. She tries to exercise and often is able to make it to the gym 3 times a week and she is unable to exercise more. Phentermine helped the fatigue some.   She has insomnia and worries a lot at night.   Lorazepam has helped her sleep.    She snores loudly and has some daytime sleepiness.   Mood/cognition:    She notes some cognitive dysfunction. She has some trouble with short-term memory, spelling and with word finding. Focus is more difficult.   Phentermine helped this some.    She reports that she has had more depression with anxiety lately. Had some stress with the recent death of her grandfather., Her her car broke down and she is unable to afford to get a new one. She  used to be on citalopram 20 mg by mouth daily but is uncertain where the not she received any benefit from it. She occasionally has more anxiety that can be triggered by events like driving in a construction area or at night.  EPWORTH SLEEPINESS SCALE  On a scale of 0 - 3 what is the chance of dozing:  Sitting and Reading:   1 Watching TV:    2 Sitting inactive in a public place: 0 Passenger in car for one hour: 1 Lying down to rest in the afternoon: 3 Sitting and talking to someone: 0 Sitting quietly after lunch:  3 In a car, stopped in traffic:  0  Total (out of 24):   10/24     REVIEW OF SYSTEMS: Constitutional: No fevers, chills, sweats, or change in  appetite Eyes: No visual changes, double vision, eye pain Ear, nose and throat: No hearing loss, ear pain, nasal congestion, sore throat Cardiovascular: No chest pain, palpitations Respiratory: No shortness of breath at rest or with exertion.   No wheezes.  She snores GastrointestinaI: No nausea, vomiting, diarrhea, abdominal pain, fecal incontinence Genitourinary: No dysuria, urinary retention or frequency.  No nocturia. Musculoskeletal: No neck pain, back pain Integumentary: No rash, pruritus, skin lesions Neurological: as above.    Has occipital headaches at times, worse when has busier day.    Psychiatric: Notes depressionand anxiety Endocrine: No palpitations, diaphoresis, change in appetite, change in weigh or increased thirst Hematologic/Lymphatic: No anemia, purpura, petechiae. Allergic/Immunologic: No itchy/runny eyes, nasal congestion, recent allergic reactions, rashes  ALLERGIES: No Known Allergies  HOME MEDICATIONS:  Current outpatient prescriptions:  .  bisoprolol-hydrochlorothiazide (ZIAC) 10-6.25 MG per tablet, Take 2 tablets by mouth daily., Disp: 60 tablet, Rfl: 5 .  FLUoxetine (PROZAC) 20 MG capsule, Take 1 capsule (20 mg total) by mouth daily. (Patient taking differently: Take 40 mg by mouth daily. ), Disp: 30 capsule, Rfl: 11 .  Guaifenesin (MUCINEX MAXIMUM STRENGTH) 1200 MG TB12, Take 1 tablet (1,200 mg total) by mouth every 12 (twelve) hours as needed., Disp: 14 tablet, Rfl: 1 .  interferon beta-1a (REBIF REBIDOSE) 44 MCG/0.5ML injection, Inject 44 mcg into the skin 3 (three) times a week., Disp: , Rfl:  .  ipratropium (ATROVENT) 0.03 % nasal spray, Place 2 sprays into the nose 2 (two) times daily., Disp: 30 mL, Rfl: 0 .  levothyroxine (SYNTHROID, LEVOTHROID) 25 MCG tablet, Take 25 mcg by mouth daily before breakfast., Disp: , Rfl:  .  lisinopril (PRINIVIL,ZESTRIL) 10 MG tablet, Take 1 tablet (10 mg total) by mouth daily., Disp: 30 tablet, Rfl: 0 .  LORazepam  (ATIVAN) 0.5 MG tablet, Take 1 tablet (0.5 mg total) by mouth every 8 (eight) hours., Disp: 60 tablet, Rfl: 5 .  chlorpheniramine-HYDROcodone (TUSSIONEX PENNKINETIC ER) 10-8 MG/5ML LQCR, Take 5 mLs by mouth every 12 (twelve) hours as needed (cough). (Patient not taking: Reported on 02/10/2015), Disp: 140 mL, Rfl: 0 .  oseltamivir (TAMIFLU) 75 MG capsule, Take 1 capsule (75 mg total) by mouth 2 (two) times daily. (Patient not taking: Reported on 02/10/2015), Disp: 10 capsule, Rfl: 0 .  phentermine 37.5 MG capsule, Take 1 capsule (37.5 mg total) by mouth every morning. (Patient not taking: Reported on 08/11/2015), Disp: 30 capsule, Rfl: 5 .  pravastatin (PRAVACHOL) 40 MG tablet, Take 1 tablet (40 mg total) by mouth daily. (Patient not taking: Reported on 02/10/2015), Disp: 30 tablet, Rfl: 0 .  solifenacin (VESICARE) 5 MG tablet, Take 5 mg by mouth daily., Disp: ,  Rfl:   PAST MEDICAL HISTORY: Past Medical History  Diagnosis Date  . Depression   . Hypertension   . MS (multiple sclerosis) 2011  . Hyperlipidemia   . Headache   . Vision abnormalities     PAST SURGICAL HISTORY: No past surgical history on file.  FAMILY HISTORY: Family History  Problem Relation Age of Onset  . Hypertension Mother   . Hypertension Father   . Stroke Father   . Diabetes Father   . Heart disease Maternal Grandmother   . Cancer Maternal Grandfather   . Heart disease Paternal Grandmother   . Asthma Paternal Grandfather     SOCIAL HISTORY:  Social History   Social History  . Marital Status: Single    Spouse Name: N/A  . Number of Children: 0  . 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 Smoking status: Never Smoker   . Smokeless tobacco: Never Used  . Alcohol Use: No  . Drug Use: No  . Sexual Activity:    Partners: Male    Birth Control/ Protection: Condom   Other Topics Concern  . Not on file   Social History Narrative    Lives alone.     PHYSICAL EXAM  Filed Vitals:   08/11/15 1050  BP: 148/82  Pulse: 62  Resp: 20  Height:  (1.626 m)  Weight: 244 lb (110.678 kg)    Body mass index is 41.86 kg/(m^2).   General: The patient is well-developed and well-nourished and in no acute distress  Eyes:  Funduscopic exam shows mild right optic disc pallor and normal retinal vessels.  Neck: The neck is supple, no carotid bruits are noted.  The neck is nontender.  Cardiovascular: The heart has a regular rate and rhythm with a normal S1 and S2. There were no murmurs, gallops or rubs. Lungs are clear to auscultation.  Skin: Extremities are without significant edema.  Musculoskeletal:  Back is nontender  Neurologic Exam  Mental status: The patient is alert and oriented x 3 at the time of the examination. The patient has apparent normal recent and remote memory, with an apparently normal attention span and concentration ability.   Speech is normal.  Cranial nerves: Extraocular movements are full. Pupils  Show 1+ right APD.  Decreased color vision OD.  Visual fields are full.  Facial symmetry is present. There is good facial sensation to soft touch bilaterally.Facial strength is normal.  Trapezius and sternocleidomastoid strength is normal. No dysarthria is noted.  The tongue is midline, and the patient has symmetric elevation of the soft palate. No obvious hearing deficits are noted.  Motor:  Muscle bulk is normal.   Tone is increased in legs.. Strength is  5 / 5 in all 4 extremities.   Sensory: Sensory testing is intact to pinprick, soft touch and vibration sensation in all 4 extremities.  Coordination: Cerebellar testing reveals good finger-nose-finger and heel-to-shin bilaterally.  Gait and station: Station is normal.   Gait is slightly wode. Tandem gait is moderately wide. Romberg is negative.   Reflexes: Deep tendon reflexes are symmetric and normal bilaterally.   Plantar responses are  flexor.    DIAGNOSTIC DATA (LABS, IMAGING, TESTING) - I reviewed patient records, labs, notes, testing and imaging myself where available.     ASSESSMENT AND PLAN  Multiple sclerosis  Depression  Urinary frequency  Other fatigue  Disturbed cognition  Ataxic  gait  Snoring - Plan: Split night study  Excessive daytime sleepiness - Plan: Split night study   1.   Continue Rebif for MS 2.  Renew phentermine for weight loss, attention deficit and fatigue. 3.  Renewed tramadol for pain. 4.   She will continue to try to exercise more. 5.   I will get a sleep study because she has snoring with excessive daytime sleepiness and nocturia. This might improve if she has sleep apnea that is treated.   She will return to see me in 4 months if stable or call earlier if she has new or worsening neurologic symptoms.    Richard A. Epimenio Foot, MD, PhD 08/11/2015, 10:58 AM Certified in Neurology, Clinical Neurophysiology, Sleep Medicine, Pain Medicine and Neuroimaging  [facility] [address] [place], [place] [address] [phone]  v

## 2015-08-18 ENCOUNTER — Ambulatory Visit: Payer: BLUE CROSS/BLUE SHIELD | Admitting: Neurology

## 2015-09-05 ENCOUNTER — Telehealth: Payer: Self-pay

## 2015-09-05 NOTE — Telephone Encounter (Signed)
[employer] has approved the request to extend coverage on Rebif effective until 09/03/2017 or until the policy changes or is terminated.  Ref ID # [id number]

## 2015-09-06 ENCOUNTER — Other Ambulatory Visit: Payer: Self-pay | Admitting: Neurology

## 2015-09-06 MED ORDER — LORAZEPAM 0.5 MG PO TABS: 0.5000 mg | ORAL_TABLET | Freq: Three times a day (TID) | ORAL | Status: DC

## 2015-09-06 NOTE — Telephone Encounter (Signed)
Pt needs refill on LORazepam (ATIVAN) 0.5 MG tablet. [facility] as [facility] insurance. May call pt [phone]

## 2015-09-07 ENCOUNTER — Encounter: Payer: Self-pay | Admitting: *Deleted

## 2015-09-07 NOTE — Progress Notes (Signed)
Lorazepam rx. up front [facility]/fim

## 2015-09-19 ENCOUNTER — Telehealth: Payer: Self-pay | Admitting: Neurology

## 2015-09-19 NOTE — Telephone Encounter (Signed)
I have spoken with [facility] and advised tablet form of Phentermine is ok, as long as it is same strength/fim

## 2015-09-19 NOTE — Telephone Encounter (Signed)
[facility], in [place], [place] called and called to see if the pt could have phentermine 37.5 MG capsule in tablet instead. It is cheaper for the pt.  Phone: [phone]  Thank you

## 2015-09-22 ENCOUNTER — Telehealth: Payer: Self-pay | Admitting: Neurology

## 2015-09-22 NOTE — Telephone Encounter (Signed)
Rx. was placed up front [facility] for pt. to pick up on 09-07-15.  I have retrieved it from the front and faxed it to [facility] in [facility], fax # [phone]/fim

## 2015-09-22 NOTE — Telephone Encounter (Signed)
Patient is calling to get a Rx called to  [facility] in [facility] for LORazepam (ATIVAN) 0.5 MG tablet. The telephone number for [facility] is [phone]. Thank you.

## 2015-10-13 ENCOUNTER — Other Ambulatory Visit: Payer: Self-pay | Admitting: Neurology

## 2015-10-13 MED ORDER — INTERFERON BETA-1A 44 MCG/0.5ML ~~LOC~~ SOAJ: 44.0000 ug | SUBCUTANEOUS | Status: DC

## 2015-10-13 NOTE — Telephone Encounter (Signed)
Victorino Dike with [facility] is calling regarding the patient. The patient has new insurance therefore needs a new Rx for interferon beta-1a (REBIF REBIDOSE) 44 MCG/0.5ML injection faxed to [facility] at [phone]. The request has been faxed to the office as well. Thank you.

## 2015-10-14 ENCOUNTER — Encounter: Payer: Self-pay | Admitting: *Deleted

## 2015-10-17 ENCOUNTER — Encounter: Payer: Self-pay | Admitting: *Deleted

## 2015-11-14 ENCOUNTER — Telehealth: Payer: Self-pay | Admitting: *Deleted

## 2015-11-14 NOTE — Telephone Encounter (Signed)
LMTC.  Joanne Parker currently has an appt. with RAS on 12-08-15 at 3pm.  He will be in a meeting at that time.  I need to r/s her appt./fim

## 2015-12-08 ENCOUNTER — Encounter: Payer: Self-pay | Admitting: Neurology

## 2015-12-08 ENCOUNTER — Ambulatory Visit: Payer: BLUE CROSS/BLUE SHIELD | Admitting: Neurology

## 2015-12-08 ENCOUNTER — Ambulatory Visit (INDEPENDENT_AMBULATORY_CARE_PROVIDER_SITE_OTHER): Payer: Medicare PPO | Admitting: Neurology

## 2015-12-08 VITALS — BP 126/76 | HR 64 | Resp 16 | Ht 64.0 in | Wt 235.4 lb

## 2015-12-08 DIAGNOSIS — G35 Multiple sclerosis: Secondary | ICD-10-CM

## 2015-12-08 DIAGNOSIS — R4189 Other symptoms and signs involving cognitive functions and awareness: Secondary | ICD-10-CM

## 2015-12-08 DIAGNOSIS — G4719 Other hypersomnia: Secondary | ICD-10-CM | POA: Diagnosis not present

## 2015-12-08 DIAGNOSIS — R350 Frequency of micturition: Secondary | ICD-10-CM | POA: Diagnosis not present

## 2015-12-08 DIAGNOSIS — F329 Major depressive disorder, single episode, unspecified: Secondary | ICD-10-CM | POA: Diagnosis not present

## 2015-12-08 DIAGNOSIS — G4489 Other headache syndrome: Secondary | ICD-10-CM | POA: Diagnosis not present

## 2015-12-08 DIAGNOSIS — R5383 Other fatigue: Secondary | ICD-10-CM | POA: Diagnosis not present

## 2015-12-08 DIAGNOSIS — R260 Ataxic gait: Secondary | ICD-10-CM | POA: Diagnosis not present

## 2015-12-08 DIAGNOSIS — F32A Depression, unspecified: Secondary | ICD-10-CM

## 2015-12-08 MED ORDER — KETOROLAC TROMETHAMINE 60 MG/2ML IM SOLN
60.0000 mg | Freq: Once | INTRAMUSCULAR | Status: AC
  Administered 2015-12-08: 60 mg via INTRAMUSCULAR

## 2015-12-08 MED ORDER — LORAZEPAM 0.5 MG PO TABS: 0.5000 mg | ORAL_TABLET | Freq: Two times a day (BID) | ORAL | Status: DC | PRN

## 2015-12-08 MED ORDER — PHENTERMINE HCL 37.5 MG PO TABS: 37.5000 mg | ORAL_TABLET | Freq: Every day | ORAL | Status: DC

## 2015-12-08 MED ORDER — TRAMADOL HCL 50 MG PO TABS: 50.0000 mg | ORAL_TABLET | Freq: Four times a day (QID) | ORAL | Status: DC | PRN

## 2015-12-08 NOTE — Progress Notes (Signed)
[facility]  PATIENT: Joanne Parker DOB: 1961-04-18  REFERRING DOCTOR OR PCP:  Garner Nash SOURCE: Patient  _________________________________   HISTORICAL  CHIEF COMPLAINT:  Chief Complaint  Patient presents with  . Multiple Sclerosis    Sts. she continues to tolerate Rebif well.  Sts. has had a frontal h/a for the last 3-4 days.  Denies visual disturbance/optic pain.  Denies sinus congestion./fim    HISTORY OF PRESENT ILLNESS:  Joanne Parker is a 54 year old woman with MS diagnosed in 2011.   She feels the MS is stable.   She is on Rebif and feels she tolerates it well.   Headaches are worse.   She is living in [facility] with her mom now.      Gait/strength/sensation:  She has mild difficulty with her gait and balance.   She has not fallen but will stumble easily.   She notes some spasticity in legs.   She denies actual weakness or numbness in limbs.  Vision:   She has had mild right eye vision changes x years. She denies any change in color vision. There is no eye change and no eye infections.  Bladder:    She reports urinary frequency and urgency. She was placed on oxybutynin with minimal benefit. Vesicare was a little better but her copay was very high and her PCP took her off.  Myrbetriq helped the most but is not covered for her.   She has 5 times nocturia now  Fatigue/sleep:    She notes fatigue and often feels she just needs to rest all day.    She used to exercise and often is able to make it to the gym 3 times a week and she is unable to exercise more. Phentermine helped the fatigue some.   She has insomnia and worries a lot at night before she falls asleep.   Lorazepam has helped her sleep.    She snores loudly and has mild daytime sleepiness.   No one has noted snorts or gasps.   She never had the PSG as she moved away  Mood/cognition:    Mood is better since starting a part time job and going up on Prozac.   She feels anxiety is well controlled  on lorazepam.   She notes some cognitive dysfunction. She has some trouble with short-term memory, spelling and with word finding. Focus is more difficult.   Phentermine helped this some but was poorly tolerated.   .    EPWORTH SLEEPINESS SCALE  On a scale of 0 - 3 what is the chance of dozing:  Sitting and Reading:   1 Watching TV:    2 Sitting inactive in a public place: 0 Passenger in car for one hour: 1 Lying down to rest in the afternoon: 3 Sitting and talking to someone: 0 Sitting quietly after lunch:  2 In a car, stopped in traffic:  0  Total (out of 24):   9/24  Headaches:   She has left frontal headaches > right side.   Pain is throbbing at times.   She deneis nausea or photophobia  MS History:   She began to experience difficulties with her gait in December 2010. She felt the onset of gait disorder was fairly sudden. She noted bilateral leg clumsiness also noted that her handwriting was worse (right-handed). An MRI of the brain was performed on 02/12/2010 showed multiple white matter foci including one enhancing lesion in the left posterior limb of the internal  capsule. Lesions were infratentorial and supratentorial with many in the periventricular white matter. Of note, in 2009 she had noted numbness in one of her legs that lasted for 2 or 3 weeks after she was placed on a muscle relaxant. I saw her on 12/28/2009 and felt the MRI was very consistent with MS. She received a 3 day course of IV steroids and was feeling better by the third day. She chose to start Rebif and tolerated it fairly well when she was reevaluated 6 weeks later.   Repeat MRI's of the brain 01/2011 and 10/13/2012 showed no new foci.    REVIEW OF SYSTEMS: Constitutional: No fevers, chills, sweats, or change in appetite Eyes: No visual changes, double vision, eye pain Ear, nose and throat: No hearing loss, ear pain, nasal congestion, sore throat Cardiovascular: No chest pain, palpitations Respiratory: No  shortness of breath at rest or with exertion.   No wheezes.  She snores GastrointestinaI: No nausea, vomiting, diarrhea, abdominal pain, fecal incontinence Genitourinary: No dysuria, urinary retention or frequency.  No nocturia. Musculoskeletal: No neck pain, back pain Integumentary: No rash, pruritus, skin lesions Neurological: as above.    Has occipital headaches at times, worse when has busier day.    Psychiatric: Notes depressionand anxiety Endocrine: No palpitations, diaphoresis, change in appetite, change in weigh or increased thirst Hematologic/Lymphatic: No anemia, purpura, petechiae. Allergic/Immunologic: No itchy/runny eyes, nasal congestion, recent allergic reactions, rashes  ALLERGIES: No Known Allergies  HOME MEDICATIONS:  Current outpatient prescriptions:  .  bisoprolol-hydrochlorothiazide (ZIAC) 10-6.25 MG per tablet, Take 2 tablets by mouth daily., Disp: 60 tablet, Rfl: 5 .  cholecalciferol (VITAMIN D) 1000 UNITS tablet, Take 2,000 Units by mouth daily., Disp: , Rfl:  .  FLUoxetine (PROZAC) 40 MG capsule, , Disp: , Rfl: 0 .  interferon beta-1a (REBIF REBIDOSE) 44 MCG/0.5ML injection, Inject 44 mcg into the skin 3 (three) times a week., Disp: , Rfl:  .  ipratropium (ATROVENT) 0.03 % nasal spray, Place 2 sprays into the nose 2 (two) times daily., Disp: 30 mL, Rfl: 0 .  levothyroxine (SYNTHROID, LEVOTHROID) 25 MCG tablet, Take 25 mcg by mouth daily before breakfast., Disp: , Rfl:  .  lisinopril (PRINIVIL,ZESTRIL) 10 MG tablet, Take 1 tablet (10 mg total) by mouth daily., Disp: 30 tablet, Rfl: 0 .  LORazepam (ATIVAN) 0.5 MG tablet, Take 1 tablet (0.5 mg total) by mouth every 8 (eight) hours., Disp: 180 tablet, Rfl: 1 .  traMADol (ULTRAM) 50 MG tablet, Take 1 tablet (50 mg total) by mouth every 6 (six) hours as needed., Disp: 90 tablet, Rfl: 3 .  chlorpheniramine-HYDROcodone (TUSSIONEX PENNKINETIC ER) 10-8 MG/5ML LQCR, Take 5 mLs by mouth every 12 (twelve) hours as needed  (cough). (Patient not taking: Reported on 02/10/2015), Disp: 140 mL, Rfl: 0 .  Guaifenesin (MUCINEX MAXIMUM STRENGTH) 1200 MG TB12, Take 1 tablet (1,200 mg total) by mouth every 12 (twelve) hours as needed. (Patient not taking: Reported on 12/08/2015), Disp: 14 tablet, Rfl: 1 .  oseltamivir (TAMIFLU) 75 MG capsule, Take 1 capsule (75 mg total) by mouth 2 (two) times daily. (Patient not taking: Reported on 02/10/2015), Disp: 10 capsule, Rfl: 0 .  phentermine 37.5 MG capsule, Take 1 capsule (37.5 mg total) by mouth every morning. (Patient not taking: Reported on 12/08/2015), Disp: 30 capsule, Rfl: 5 .  pravastatin (PRAVACHOL) 40 MG tablet, Take 1 tablet (40 mg total) by mouth daily. (Patient not taking: Reported on 02/10/2015), Disp: 30 tablet, Rfl: 0  PAST MEDICAL HISTORY: Past  Medical History  Diagnosis Date  . Depression   . Hypertension   . MS (multiple sclerosis) (HCC) 2011  . Hyperlipidemia   . Headache   . Vision abnormalities     PAST SURGICAL HISTORY: History reviewed. No pertinent past surgical history.  FAMILY HISTORY: Family History  Problem Relation Age of Onset  . Hypertension Mother   . Hypertension Father   . Stroke Father   . Diabetes Father   . Heart disease Maternal Grandmother   . Cancer Maternal Grandfather   . Heart disease Paternal Grandmother   . Asthma Paternal Grandfather     SOCIAL HISTORY:  Social History   Social History  . Marital Status: Single    Spouse Name: N/A  . Number of Children: 0  . 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 Smoking status: Never Smoker   . Smokeless tobacco: Never Used  . Alcohol Use: No  . Drug Use: No  . Sexual Activity:    Partners: Male    Birth Control/ Protection: Condom   Other Topics Concern  . Not on file   Social History Narrative   Lives alone.     PHYSICAL EXAM  Filed Vitals:   12/08/15 1412  BP: 126/76  Pulse:  64  Resp: 16  Height: 5\' 4"  (1.626 m)  Weight: 235 lb 6.4 oz (106.777 kg)    Body mass index is 40.39 kg/(m^2).   General: The patient is well-developed and well-nourished and in no acute distress  Eyes:  Funduscopic exam shows mild right optic disc pallor and normal retinal vessels.  Neck: The neck is supple, no carotid bruits are noted.  The neck is nontender.  Cardiovascular: The heart has a regular rate and rhythm with a normal S1 and S2. There were no murmurs, gallops or rubs. Lungs are clear to auscultation.  Skin: Extremities are without significant edema.  Musculoskeletal:  Back is nontender  Neurologic Exam  Mental status: The patient is alert and oriented x 3 at the time of the examination. The patient has apparent normal recent and remote memory, with an apparently normal attention span and concentration ability.   Speech is normal.  Cranial nerves: Extraocular movements are full. Pupils  Show 1+ right APD.  Decreased color vision OD.  Visual fields are full.  Facial symmetry is present. There is good facial sensation to soft touch bilaterally.Facial strength is normal.  Trapezius and sternocleidomastoid strength is normal. No dysarthria is noted.  The tongue is midline, and the patient has symmetric elevation of the soft palate. No obvious hearing deficits are noted.  Motor:  Muscle bulk is normal.   Tone is increased in legs.. Strength is  5 / 5 in all 4 extremities.   Sensory: Sensory testing is intact to pinprick, soft touch and vibration sensation in all 4 extremities.  Coordination: Cerebellar testing reveals good finger-nose-finger and heel-to-shin bilaterally.  Gait and station: Station is normal.   Gait is slightly wode. Tandem gait is moderately wide. Romberg is negative.   Reflexes: Deep tendon reflexes are symmetric and normal bilaterally.   Plantar responses are flexor.    DIAGNOSTIC DATA (LABS, IMAGING, TESTING) - I reviewed patient records, labs,  notes, testing and imaging myself where available.     ASSESSMENT AND PLAN  Multiple sclerosis (HCC)  Ataxic gait  Depression  Disturbed cognition  Excessive daytime sleepiness  Other fatigue  Urinary frequency  Other headache syndrome    1.   Continue Rebif for MS 2.   Renew phentermine for weight loss, attention deficit and fatigue. 3.   Toradol 60 mg for headache.   4.    She will return to see me in 4 months if stable or call earlier if she has new or worsening neurologic symptoms.    Stanislav Gervase A. Epimenio Foot, MD, PhD 12/08/2015, 2:37 PM Certified in Neurology, Clinical Neurophysiology, Sleep Medicine, Pain Medicine and Neuroimaging  [facility] [address], [address] [place], [place] [address] [phone]  v

## 2015-12-15 ENCOUNTER — Other Ambulatory Visit: Payer: Self-pay | Admitting: *Deleted

## 2015-12-15 MED ORDER — INTERFERON BETA-1A 44 MCG/0.5ML ~~LOC~~ SOAJ: 44.0000 ug | SUBCUTANEOUS | Status: DC

## 2016-05-03 ENCOUNTER — Ambulatory Visit: Payer: Medicare PPO | Admitting: Neurology

## 2016-05-17 ENCOUNTER — Ambulatory Visit (INDEPENDENT_AMBULATORY_CARE_PROVIDER_SITE_OTHER): Payer: Medicare PPO | Admitting: Neurology

## 2016-05-17 ENCOUNTER — Encounter: Payer: Self-pay | Admitting: Neurology

## 2016-05-17 VITALS — BP 112/60 | HR 72 | Resp 18 | Ht 64.0 in | Wt 241.5 lb

## 2016-05-17 DIAGNOSIS — R260 Ataxic gait: Secondary | ICD-10-CM

## 2016-05-17 DIAGNOSIS — G35 Multiple sclerosis: Secondary | ICD-10-CM

## 2016-05-17 DIAGNOSIS — F329 Major depressive disorder, single episode, unspecified: Secondary | ICD-10-CM

## 2016-05-17 DIAGNOSIS — R4189 Other symptoms and signs involving cognitive functions and awareness: Secondary | ICD-10-CM

## 2016-05-17 DIAGNOSIS — F32A Depression, unspecified: Secondary | ICD-10-CM

## 2016-05-17 DIAGNOSIS — R350 Frequency of micturition: Secondary | ICD-10-CM

## 2016-05-17 DIAGNOSIS — M25562 Pain in left knee: Secondary | ICD-10-CM

## 2016-05-17 DIAGNOSIS — R5383 Other fatigue: Secondary | ICD-10-CM

## 2016-05-17 MED ORDER — LORAZEPAM 0.5 MG PO TABS: 0.5000 mg | ORAL_TABLET | Freq: Two times a day (BID) | ORAL | Status: DC | PRN

## 2016-05-17 MED ORDER — TOPIRAMATE 100 MG PO TABS: 100.0000 mg | ORAL_TABLET | Freq: Two times a day (BID) | ORAL | Status: DC

## 2016-05-17 MED ORDER — MIRABEGRON ER 50 MG PO TB24: 50.0000 mg | ORAL_TABLET | Freq: Every day | ORAL | Status: DC

## 2016-05-17 MED ORDER — PHENTERMINE HCL 37.5 MG PO TABS: 37.5000 mg | ORAL_TABLET | Freq: Every day | ORAL | Status: DC

## 2016-05-17 NOTE — Progress Notes (Signed)
[facility]  PATIENT: Joanne Parker DOB: 08/11/1961  REFERRING DOCTOR OR PCP:  Garner Nash  SOURCE: Patient  _________________________________   HISTORICAL  CHIEF COMPLAINT:  Chief Complaint  Patient presents with  . Multiple Sclerosis    Sts. she continues to tolerate Rebif well.  Sts. she is having more difficulty with urinary urgency.  Oxybutynin stopped helping, so RAS switched her to Myrbetriq, but even with ins. coverage her copay was $50 per month and she couldn't afford this/fim    HISTORY OF PRESENT ILLNESS:  Joanne Parker is a 55 year old woman with MS diagnosed in 2011.   She feels the MS is stable.   She is on Rebif and feels she tolerates it well.   She is noting more bladder difficulties And left knee pain.     She is living in [facility] with her mom now.      Gait/strength/sensation:  She has mild difficulty with her gait and balance.   She has not fallen but will stumble easily.   She notes some spasticity in legs.   She denies actual weakness or numbness in limbs.  Vision:   She has had mild right eye vision changes x years.  She tries to avoid driving at night now.   She denies any change in color vision. There is no eye change and no eye infections.  Bladder:    She is noting more urinary frequency and urgency. She was placed on oxybutynin with minimal benefit at standard dose and higher dose was not tolerated.  Assunta Found was a little better and better tolerated but her copay was very high and her PCP took her off.  Myrbetriq helped the most but has a high co-pay (tried in 2016)..   She has 5 times nocturia and has near-incontinence frequently  Fatigue/sleep:    She notes fatigue and often feels she just needs to rest all day.    She used to exercise and often is able to make it to the gym 3 times a week and she is unable to exercise more. Phentermine helped the fatigue some.   She has insomnia and worries a lot at night before she falls  asleep.   Lorazepam has helped her sleep.    She snores loudly and has mild daytime sleepiness.   No one has noted snorts or gasps.   She never had the PSG as she moved away  Mood/cognition:    Mood is doing ok.   She feels benefit of increased Prozac.   She feels anxiety is well controlled on lorazepam.   She notes some cognitive dysfunction. She has trouble with short-term memory, spelling and with word finding. Focus and attention are difficult.   Phentermine helps this some  Headaches:   She has left frontal headaches > right side.   Pain is throbbing at times.   She deneis nausea or photophobia.   Moving increase the pain when present.  Knee Pain:   She has had a lot of left knee pain the past 2 months, worse when climbing stairs.    Tramadol helps slightly but pain comes right back.  She denies leg weakness.  Other:   She is having trouble losing weight, despite being on phentermine.  MS History:   She began to experience difficulties with her gait in December 2010. She felt the onset of gait disorder was fairly sudden. She noted bilateral leg clumsiness also noted that her handwriting was worse (right-handed). An MRI  of the brain was performed on 02/12/2010 showed multiple white matter foci including one enhancing lesion in the left posterior limb of the internal capsule. Lesions were infratentorial and supratentorial with many in the periventricular white matter. Of note, in 2009 she had noted numbness in one of her legs that lasted for 2 or 3 weeks after she was placed on a muscle relaxant. I saw her on 12/28/2009 and felt the MRI was very consistent with MS. She received a 3 day course of IV steroids and was feeling better by the third day. She chose to start Rebif and tolerated it fairly well when she was reevaluated 6 weeks later.   Repeat MRI's of the brain 01/2011 and 10/13/2012 showed no new foci.    REVIEW OF SYSTEMS: Constitutional: No fevers, chills, sweats, or change in appetite.   She notes fatigue. Eyes: No visual changes, double vision, eye pain Ear, nose and throat: No hearing loss, ear pain, nasal congestion, sore throat Cardiovascular: No chest pain, palpitations Respiratory: No shortness of breath at rest or with exertion.   No wheezes.  She snores GastrointestinaI: No nausea, vomiting, diarrhea, abdominal pain, fecal incontinence Genitourinary: No dysuria, urinary retention.   Severe urinary frequency with nocturia. Musculoskeletal: No neck pain, back pain.   She is having left knee pain Integumentary: No rash, pruritus, skin lesions Neurological: as above.    Has occipital headaches at times, worse when has busier day.    Psychiatric: Notes depressionand anxiety Endocrine: No palpitations, diaphoresis, change in appetite, change in weigh or increased thirst Hematologic/Lymphatic: No anemia, purpura, petechiae. Allergic/Immunologic: No itchy/runny eyes, nasal congestion, recent allergic reactions, rashes  ALLERGIES: No Known Allergies  HOME MEDICATIONS:  Current outpatient prescriptions:  .  bisoprolol-hydrochlorothiazide (ZIAC) 10-6.25 MG per tablet, Take 2 tablets by mouth daily., Disp: 60 tablet, Rfl: 5 .  cholecalciferol (VITAMIN D) 1000 UNITS tablet, Take 2,000 Units by mouth daily., Disp: , Rfl:  .  FLUoxetine (PROZAC) 40 MG capsule, , Disp: , Rfl: 0 .  Interferon Beta-1a (REBIF REBIDOSE) 44 MCG/0.5ML SOAJ, Inject 44 mcg into the skin 3 (three) times a week., Disp: 36 Syringe, Rfl: 3 .  ipratropium (ATROVENT) 0.03 % nasal spray, Place 2 sprays into the nose 2 (two) times daily., Disp: 30 mL, Rfl: 0 .  levothyroxine (SYNTHROID, LEVOTHROID) 25 MCG tablet, Take 25 mcg by mouth daily before breakfast., Disp: , Rfl:  .  lisinopril (PRINIVIL,ZESTRIL) 10 MG tablet, Take 1 tablet (10 mg total) by mouth daily., Disp: 30 tablet, Rfl: 0 .  LORazepam (ATIVAN) 0.5 MG tablet, Take 1 tablet (0.5 mg total) by mouth 2 (two) times daily as needed for anxiety., Disp:  180 tablet, Rfl: 1 .  phentermine (ADIPEX-P) 37.5 MG tablet, Take 1 tablet (37.5 mg total) by mouth daily before breakfast., Disp: 30 tablet, Rfl: 5 .  traMADol (ULTRAM) 50 MG tablet, Take 1 tablet (50 mg total) by mouth every 6 (six) hours as needed., Disp: 90 tablet, Rfl: 3  PAST MEDICAL HISTORY: Past Medical History  Diagnosis Date  . Depression   . Hypertension   . MS (multiple sclerosis) (HCC) 2011  . Hyperlipidemia   . Headache   . Vision abnormalities     PAST SURGICAL HISTORY: History reviewed. No pertinent past surgical history.  FAMILY HISTORY: Family History  Problem Relation Age of Onset  . Hypertension Mother   . Hypertension Father   . Stroke Father   . Diabetes Father   . Heart disease Maternal Grandmother   .  Cancer Maternal Grandfather   . Heart disease Paternal Grandmother   . Asthma Paternal Grandfather     SOCIAL HISTORY:  Social History   Social History  . Marital Status: Single    Spouse Name: N/A  . Number of Children: 0  . 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 Smoking status: Never Smoker   . Smokeless tobacco: Never Used  . Alcohol Use: No  . Drug Use: No  . Sexual Activity:    Partners: Male    Birth Control/ Protection: Condom   Other Topics Concern  . Not on file   Social History Narrative   Lives alone.     PHYSICAL EXAM  Filed Vitals:   05/17/16 1416  BP: 112/60  Pulse: 72  Resp: 18  Height:  (1.626 m)  Weight: 241 lb 8 oz (109.544 kg)    Body mass index is 41.43 kg/(m^2).   General: The patient is well-developed and well-nourished and in no acute distress  Musculoskeletal:  Back is nontender.   She is tender at the left knee.  Neurologic Exam  Mental status: The patient is alert and oriented x 3 at the time of the examination. The patient has apparent normal recent and remote memory, with an apparently normal attention  span and concentration ability.   Speech is normal.  Cranial nerves: Extraocular movements are full. Pupils  Show 1+ right APD.  Decreased OD color vision.    There is good facial sensation to soft touch bilaterally.Facial strength is normal.  Trapezius and sternocleidomastoid strength is normal. No dysarthria is noted.  The tongue is midline, and the patient has symmetric elevation of the soft palate. No obvious hearing deficits are noted.  Motor:  Muscle bulk is normal.   Tone is increased in legs.. Strength is  5 / 5 in all 4 extremities.   Sensory: Sensory testing is intact to pinprick, soft touch and vibration sensation in all 4 extremities.  Coordination: Cerebellar testing reveals good finger-nose-finger and heel-to-shin bilaterally.  Gait and station: Station is normal.   Gait is slightly wide. Tandem gait is moderately wide. Romberg is negative.   Reflexes: Deep tendon reflexes are symmetric and normal bilaterally.   Plantar responses are flexor.    DIAGNOSTIC DATA (LABS, IMAGING, TESTING) - I reviewed patient records, labs, notes, testing and imaging myself where available.     ASSESSMENT AND PLAN  Multiple sclerosis (HCC)  Urinary frequency  Ataxic gait  Depression  Disturbed cognition  Other fatigue  Knee pain, left    1.   Continue Rebif for MS 2.   Renew phentermine for weight loss, attention deficit and fatigue.   Add topiramate for migraine...may also help weight loss 3.   Inject left knee with 40 mg Depo-Medrol in 3 mL Marcaine using sterile technique. She tolerated the procedure well and the pain was much better after a few minutes.  4.    She will return to see me in 4 months if stable or call earlier if she has new or worsening neurologic symptoms.   45 minutes face-to-face evaluation with greater than one half of the time counseling and coordinating care about her MS and including worsening urinary function and pain.  Richard A. Epimenio Foot, MD, PhD  05/17/2016, 2:22 PM Certified in Neurology, Clinical Neurophysiology, Sleep Medicine, Pain Medicine and Neuroimaging  [facility] [address] [place], [place] [address] [phone]  v4

## 2016-05-18 ENCOUNTER — Ambulatory Visit: Payer: Medicare PPO | Admitting: Neurology

## 2016-06-04 ENCOUNTER — Telehealth: Payer: Self-pay | Admitting: Neurology

## 2016-06-04 NOTE — Telephone Encounter (Signed)
LM to let patient know that we got her message and that Dr. Epimenio Foot is out of the office. I asked her to call back with more detail and on-call doctor will advise. Left call back number.

## 2016-06-04 NOTE — Telephone Encounter (Signed)
Patient reports new  that started last Wednesday. Dizzy , will cry for no reason, legs are weak. While driving she felt like her legs were asleep. Pt has appt with primary at 1p An appointment was made for : NO appt with this office , wants to see what her pcp says.  Gave the patient the option to go to the Emergency Room. Patient declined.  ised that the nurse would call if there was any other questions.

## 2016-06-04 NOTE — Telephone Encounter (Signed)
LMOM for pt. to call prn--RAS is out of the office this week, but I can give appt. with either Aundra Millet or Eber Jones to eval sx./fim

## 2016-06-04 NOTE — Telephone Encounter (Signed)
FYI, patient declined appt at [facility]. She is going to see PCP about this.

## 2016-06-04 NOTE — Telephone Encounter (Signed)
Patient called to advise she is having symptoms like she was having when first diagnosed with MS, states it started after she began taking topiramate (TOPAMAX) 100 MG tablet.

## 2016-06-12 ENCOUNTER — Ambulatory Visit (INDEPENDENT_AMBULATORY_CARE_PROVIDER_SITE_OTHER): Payer: Medicare PPO | Admitting: Neurology

## 2016-06-12 ENCOUNTER — Encounter: Payer: Self-pay | Admitting: Neurology

## 2016-06-12 VITALS — BP 106/64 | HR 66 | Resp 18 | Ht 64.0 in | Wt 238.0 lb

## 2016-06-12 DIAGNOSIS — R4189 Other symptoms and signs involving cognitive functions and awareness: Secondary | ICD-10-CM | POA: Diagnosis not present

## 2016-06-12 DIAGNOSIS — H469 Unspecified optic neuritis: Secondary | ICD-10-CM | POA: Diagnosis not present

## 2016-06-12 DIAGNOSIS — G4489 Other headache syndrome: Secondary | ICD-10-CM | POA: Diagnosis not present

## 2016-06-12 DIAGNOSIS — F329 Major depressive disorder, single episode, unspecified: Secondary | ICD-10-CM | POA: Diagnosis not present

## 2016-06-12 DIAGNOSIS — R5383 Other fatigue: Secondary | ICD-10-CM

## 2016-06-12 DIAGNOSIS — G4719 Other hypersomnia: Secondary | ICD-10-CM | POA: Diagnosis not present

## 2016-06-12 DIAGNOSIS — G35 Multiple sclerosis: Secondary | ICD-10-CM

## 2016-06-12 DIAGNOSIS — R260 Ataxic gait: Secondary | ICD-10-CM

## 2016-06-12 DIAGNOSIS — F32A Depression, unspecified: Secondary | ICD-10-CM

## 2016-06-12 DIAGNOSIS — R0683 Snoring: Secondary | ICD-10-CM

## 2016-06-12 MED ORDER — OXYBUTYNIN CHLORIDE 5 MG PO TABS: 5.0000 mg | ORAL_TABLET | Freq: Three times a day (TID) | ORAL | Status: DC

## 2016-06-12 NOTE — Progress Notes (Addendum)
[facility]  PATIENT: Joanne Parker DOB: 1961-10-15  REFERRING DOCTOR OR PCP:  Garner Nash  SOURCE: Patient  _________________________________   HISTORICAL  CHIEF COMPLAINT:  Chief Complaint  Patient presents with  . Multiple Sclerosis    Sts. onset 05-28-16 of increased fatigue, stumbling more when she walked, crying for no reason.  Sts. she called the office last week and was told RAS was on vacation, so she f/u with her pcp, Dr. Ree Kida, in [facility].  He ordered 3 days of outpatient IV Solumedrol.  She sts. sx. are improved but still present--still having some stumbling, is still very fatigued. She is not taking Topamax b/c she was afraid it was contributing to her sx., so h/a's are the same.  PCP d/c Phentermine./fim    HISTORY OF PRESENT ILLNESS:  Joanne Parker is a 55 year old woman with MS diagnosed in 2011.   She thinks she had a relapse last week.    Her gait was off, like walking on air and she felt much more tired and emotional.   Her legs felt like they were asleep.    She saw her PCP who prescribed 3 days of IV Solumedrol with benefit (not 100% better but at least 75%).    Of note, she had started 2 days earlier.   For MS, she is on Rebif and feels she tolerates it well.    She is living in [place] with her mom now.      Gait/strength/sensation:  She has mild difficulty with her gait and balance, improved after steroid infusions last week.   She has not fallen but will stumble easily.   She notes some spasticity in legs.   She denies much actual weakness or numbness in limbs.  Vision:   She has had mild right eye vision changes x years.  She tries to avoid driving at night now.   She denies any change in color vision. There is no eye change and no eye infections.  Bladder:    She is noting a lot of  urinary frequency and urgency and occasional incontinence (worse during recent relapse). She was placed on oxybutynin with minimal benefit at  standard dose and higher dose was not tolerated.  Vesicare was a little better and better tolerated but her copay was very high and her PCP took her off.  Myrbetriq helped the most in 2016, but is not helping much now..   She has not tried Myrbetriq/oxybutynin combo.   She has no hesitancy.     Fatigue/sleep:    She notes fatigue and often feels she just needs to rest all day.    She used to exercise and often is able to make it to the gym 3 times a week and she is unable to exercise more. Phentermine helped the fatigue some.   She has insomnia and worries a lot at night before she falls asleep.   Lorazepam has helped her sleep.    She snores loudly and has mild daytime sleepiness.   No one has noted snorts or gasps.   She never had the PSG as she moved away  Mood/cognition:    Mood is doing better than last week when she was crying for no reason.      She feels benefit of increased Prozac.   She feels anxiety is well controlled on lorazepam.   She notes mild cognitive dysfunction with reduced short-term memory, spelling and with word finding. Focus and attention are  better on Phentermine.    Headaches:   She has left frontal headaches > right side, better than last visit.   Pain is throbbing at times.   She denies nausea or photophobia.   Moving increase the pain when present.  Knee Pain:   Knee pain is much better since knee injection last visit.  Tramadol helps some when it acts up.     MS History:   She began to experience difficulties with her gait in December 2010. She felt the onset of gait disorder was fairly sudden. She noted bilateral leg clumsiness also noted that her handwriting was worse (right-handed). An MRI of the brain was performed on 02/12/2010 showed multiple white matter foci including one enhancing lesion in the left posterior limb of the internal capsule. Lesions were infratentorial and supratentorial with many in the periventricular white matter. Of note, in 2009 she had noted  numbness in one of her legs that lasted for 2 or 3 weeks after she was placed on a muscle relaxant. I saw her on 12/28/2009 and felt the MRI was very consistent with MS. She received a 3 day course of IV steroids and was feeling better by the third day. She chose to start Rebif and tolerated it fairly well when she was reevaluated 6 weeks later.   Repeat MRI's of the brain 01/2011 and 10/13/2012 showed no new foci.   She hasd an exacerbation with reduced gait and worsening bladder function June 2017.      REVIEW OF SYSTEMS: Constitutional: No fevers, chills, sweats, or change in appetite.  She notes fatigue. She sleeps well at night. Eyes: No visual changes, double vision, eye pain Ear, nose and throat: No hearing loss, ear pain, nasal congestion, sore throat Cardiovascular: No chest pain, palpitations Respiratory: No shortness of breath at rest or with exertion.   No wheezes.  She snores GastrointestinaI: No nausea, vomiting, diarrhea, abdominal pain, fecal incontinence Genitourinary: No dysuria, urinary retention.   Severe urinary frequency with nocturia.  Recent incontinence has been worse. Musculoskeletal: No neck pain, back pain.   She is having less left knee pain Integumentary: No rash, pruritus, skin lesions Neurological: as above.    Has occipital headaches at times, worse when has busier day.    Psychiatric: Notes depression and anxiety Endocrine: No palpitations, diaphoresis, change in appetite, change in weigh or increased thirst Hematologic/Lymphatic: No anemia, purpura, petechiae. Allergic/Immunologic: No itchy/runny eyes, nasal congestion, recent allergic reactions, rashes  ALLERGIES: No Known Allergies  HOME MEDICATIONS:  Current outpatient prescriptions:  .  bisoprolol-hydrochlorothiazide (ZIAC) 10-6.25 MG per tablet, Take 2 tablets by mouth daily., Disp: 60 tablet, Rfl: 5 .  cholecalciferol (VITAMIN D) 1000 UNITS tablet, Take 2,000 Units by mouth daily., Disp: , Rfl:    .  FLUoxetine (PROZAC) 40 MG capsule, , Disp: , Rfl: 0 .  Interferon Beta-1a (REBIF REBIDOSE) 44 MCG/0.5ML SOAJ, Inject 44 mcg into the skin 3 (three) times a week., Disp: 36 Syringe, Rfl: 3 .  ipratropium (ATROVENT) 0.03 % nasal spray, Place 2 sprays into the nose 2 (two) times daily., Disp: 30 mL, Rfl: 0 .  levothyroxine (SYNTHROID, LEVOTHROID) 25 MCG tablet, Take 25 mcg by mouth daily before breakfast., Disp: , Rfl:  .  lisinopril (PRINIVIL,ZESTRIL) 10 MG tablet, Take 1 tablet (10 mg total) by mouth daily., Disp: 30 tablet, Rfl: 0 .  LORazepam (ATIVAN) 0.5 MG tablet, Take 1 tablet (0.5 mg total) by mouth 2 (two) times daily as needed for anxiety., Disp: 180  tablet, Rfl: 1 .  mirabegron ER (MYRBETRIQ) 50 MG TB24 tablet, Take 1 tablet (50 mg total) by mouth daily., Disp: 90 tablet, Rfl: 3 .  Multiple Vitamin (MULTIVITAMIN) tablet, Take 1 tablet by mouth daily., Disp: , Rfl:  .  traMADol (ULTRAM) 50 MG tablet, Take 1 tablet (50 mg total) by mouth every 6 (six) hours as needed., Disp: 90 tablet, Rfl: 3 .  fluticasone (FLONASE) 50 MCG/ACT nasal spray, , Disp: , Rfl:  .  omeprazole (PRILOSEC) 20 MG capsule, , Disp: , Rfl:  .  oxybutynin (DITROPAN) 5 MG tablet, Take 1 tablet (5 mg total) by mouth 3 (three) times daily., Disp: 60 tablet, Rfl: 11 .  phentermine (ADIPEX-P) 37.5 MG tablet, Take 1 tablet (37.5 mg total) by mouth daily before breakfast. (Patient not taking: Reported on 06/12/2016), Disp: 30 tablet, Rfl: 5 .  topiramate (TOPAMAX) 100 MG tablet, Take 1 tablet (100 mg total) by mouth 2 (two) times daily. (Patient not taking: Reported on 06/12/2016), Disp: 180 tablet, Rfl: 3  PAST MEDICAL HISTORY: Past Medical History  Diagnosis Date  . Depression   . Hypertension   . MS (multiple sclerosis) (HCC) 2011  . Hyperlipidemia   . Headache   . Vision abnormalities     PAST SURGICAL HISTORY: No past surgical history on file.  FAMILY HISTORY: Family History  Problem Relation Age of Onset  .  Hypertension Mother   . Hypertension Father   . Stroke Father   . Diabetes Father   . Heart disease Maternal Grandmother   . Cancer Maternal Grandfather   . Heart disease Paternal Grandmother   . Asthma Paternal Grandfather     SOCIAL HISTORY:  Social History   Social History  . Marital Status: Single    Spouse Name: N/A  . Number of Children: 0  . 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 Smoking status: Never Smoker   . Smokeless tobacco: Never Used  . Alcohol Use: No  . Drug Use: No  . Sexual Activity:    Partners: Male    Birth Control/ Protection: Condom   Other Topics Concern  . Not on file   Social History Narrative   Lives alone.     PHYSICAL EXAM  Filed Vitals:   06/12/16 0844  BP: 106/64  Pulse: 66  Resp: 18  Height:  (1.626 m)  Weight: 238 lb (107.956 kg)    Body mass index is 40.83 kg/(m^2).   General: The patient is well-developed and well-nourished and in no acute distress  Musculoskeletal:  Back is nontender.   She is now non-tender at the left knee.  Neurologic Exam  Mental status: The patient is alert and oriented x 3 at the time of the examination. The patient has apparent normal recent and remote memory, with an apparently normal attention span and concentration ability.   Speech is normal.  Cranial nerves: Extraocular movements are full. Pupils  Show 1+ right APD.  Decreased OD color vision.    There is good facial sensation to soft touch bilaterally.Facial strength is normal.  Trapezius and sternocleidomastoid strength is normal. No dysarthria is noted.  The tongue is midline, and the patient has symmetric elevation of the soft palate. No obvious hearing deficits are noted.  Motor:  Muscle bulk is normal.   Tone is increased in legs.. Strength is  5 / 5 in all 4  extremities.   Sensory: Sensory testing is intact to pinprick, soft touch and vibration  sensation in all 4 extremities.  Coordination: Cerebellar testing reveals good finger-nose-finger and slight reduced heel-to-shin bilaterally.  Gait and station: Station is normal.   Gait is wide. Tandem gait is difficult. Romberg is negative.   Reflexes: Deep tendon reflexes are symmetric and normal bilaterally.   Plantar responses are flexor.    DIAGNOSTIC DATA (LABS, IMAGING, TESTING) - I reviewed patient records, labs, notes, testing and imaging myself where available.     ASSESSMENT AND PLAN  Multiple sclerosis (HCC) - Plan: MR Brain W Wo Contrast, MR Cervical Spine W Wo Contrast  Optic neuritis  Ataxic gait - Plan: MR Brain W Wo Contrast, MR Cervical Spine W Wo Contrast  Other fatigue  Other headache syndrome  Snoring  Excessive daytime sleepiness  Disturbed cognition  Depression    1.   Continue Rebif for MS.   Will need to check MRI brain and spinal cord.   If significant subclinical progression, we will need to consider a change to a different medication. 2.   Continue phentermine for weight loss, attention deficit and fatigue.   3.  She will try the combination of Myrbetriq's plus oxybutynin. If that does not help her bladder enough, I would like her to see a urologist for further recommendations.  4.    She will return to see me in 4 months if stable or call earlier if she has new or worsening neurologic symptoms.   45 minutes face-to-face evaluation with greater than one half of the time counseling and coordinating care about her MS and including worsening urinary function and pain.  Herberta Pickron A. Epimenio Foot, MD, PhD 06/12/2016, 9:23 AM Certified in Neurology, Clinical Neurophysiology, Sleep Medicine, Pain Medicine and Neuroimaging  [facility] [address], [address] [place], [place] [address] [phone]  v4

## 2016-06-27 ENCOUNTER — Telehealth: Payer: Self-pay | Admitting: Neurology

## 2016-06-27 NOTE — Telephone Encounter (Signed)
I have spoken with Joanne Parker this afternoon.  She has Lorazepam at home that RAS gives for anxiety.  She does not take this often--last rx. was 05-17-16 and she still has med left. She will take 1-2 0.5mg  tabs 30 min. prior to mri, then take another 0.5mg  tablet with her and repeat prn./fim

## 2016-06-27 NOTE — Telephone Encounter (Signed)
Pt called to advise she will need RX to help her relax during the MRI on Friday. Please call to [facility] in [facility] or if RX is called the day of pls send to [facility] on [address]

## 2016-07-02 ENCOUNTER — Other Ambulatory Visit: Payer: Self-pay | Admitting: Neurology

## 2016-07-02 ENCOUNTER — Ambulatory Visit
Admission: RE | Admit: 2016-07-02 | Discharge: 2016-07-02 | Disposition: A | Discharge status: Home / Self Care | Payer: Medicare PPO | Source: Ambulatory Visit | Attending: Neurology | Admitting: Neurology

## 2016-07-02 DIAGNOSIS — R260 Ataxic gait: Secondary | ICD-10-CM

## 2016-07-02 DIAGNOSIS — G35 Multiple sclerosis: Secondary | ICD-10-CM

## 2016-07-04 ENCOUNTER — Telehealth: Payer: Self-pay | Admitting: *Deleted

## 2016-07-04 NOTE — Telephone Encounter (Signed)
LMTC./fim 

## 2016-07-04 NOTE — Telephone Encounter (Signed)
-----   Message from Asa Lente, MD sent at 07/04/2016  8:32 AM EDT ----- Please let her know that the MRI of the brain is unchanged compared to the other one. The MRI of the spinal cord shows 1 old MS lesion.

## 2016-07-05 ENCOUNTER — Telehealth: Payer: Self-pay | Admitting: *Deleted

## 2016-07-05 NOTE — Telephone Encounter (Signed)
I have spoken with Sherrye, and per RAS, advised MRI brain is unchanged when compared to the last one.  MRI C-spine shows one old lesion.  She verbalized understanding of same/fim

## 2016-07-05 NOTE — Telephone Encounter (Signed)
-----   Message from Richard A Sater, MD sent at 07/04/2016  8:32 AM EDT ----- Please let her know that the MRI of the brain is unchanged compared to the other one. The MRI of the spinal cord shows 1 old MS lesion. 

## 2016-09-17 ENCOUNTER — Encounter: Payer: Self-pay | Admitting: Neurology

## 2016-10-05 ENCOUNTER — Telehealth: Payer: Self-pay | Admitting: *Deleted

## 2016-10-05 NOTE — Telephone Encounter (Signed)
Fax received from the [employer].  Joanne Parker has been approved for a $6,000 grand thru the MS-Medicare [employer].  Enrollment period is 09-04-16 thru 09-03-17.  Member ID: [id number].  Group# [id number].  BIN# [id number].  PCN# [id number].  Processor: PDMI.  Questions may be directed to the [employer] at [email or web address] or phone# [phone]/fim

## 2016-10-08 ENCOUNTER — Encounter: Payer: Self-pay | Admitting: Neurology

## 2016-10-08 ENCOUNTER — Ambulatory Visit (INDEPENDENT_AMBULATORY_CARE_PROVIDER_SITE_OTHER): Payer: Medicare PPO | Admitting: Neurology

## 2016-10-08 VITALS — BP 126/78 | HR 78 | Resp 18 | Ht 64.0 in | Wt 236.0 lb

## 2016-10-08 DIAGNOSIS — G4489 Other headache syndrome: Secondary | ICD-10-CM

## 2016-10-08 DIAGNOSIS — H469 Unspecified optic neuritis: Secondary | ICD-10-CM | POA: Diagnosis not present

## 2016-10-08 DIAGNOSIS — R5383 Other fatigue: Secondary | ICD-10-CM

## 2016-10-08 DIAGNOSIS — G8929 Other chronic pain: Secondary | ICD-10-CM | POA: Diagnosis not present

## 2016-10-08 DIAGNOSIS — G35 Multiple sclerosis: Secondary | ICD-10-CM | POA: Diagnosis not present

## 2016-10-08 DIAGNOSIS — R260 Ataxic gait: Secondary | ICD-10-CM

## 2016-10-08 DIAGNOSIS — M25562 Pain in left knee: Secondary | ICD-10-CM

## 2016-10-08 MED ORDER — PHENTERMINE HCL 37.5 MG PO TABS: 37.5000 mg | ORAL_TABLET | Freq: Every day | ORAL | 5 refills | Status: DC

## 2016-10-08 MED ORDER — LORAZEPAM 0.5 MG PO TABS: 0.5000 mg | ORAL_TABLET | Freq: Two times a day (BID) | ORAL | 1 refills | Status: DC | PRN

## 2016-10-08 NOTE — Progress Notes (Signed)
[facility]  PATIENT: Joanne Parker DOB: December 14, 1961  REFERRING DOCTOR OR PCP:  Garner Nash  SOURCE: Patient  _________________________________   HISTORICAL  CHIEF COMPLAINT:  Chief Complaint  Patient presents with  . Multiple Sclerosis    Sts. she continues to tolerate Rebif well.  Sts. bladder dysfunction is improved with Oxybutynin and Myrbetriq combination.  Sts. she continues Phentermie for wt. loss/energy, but continues to struggle with weight.  She is 2 lbs less than when she was seen in June/fim    HISTORY OF PRESENT ILLNESS:  Joanne Parker is a 55 year old woman with MS diagnosed in 2011.   Since the last visit, she notes a burning sensation and swelling sensation in hr feet.   MRI of the brain and cervical spine was performed 07/02/2016. The MRI of the cervical spine shows a subtle focus towards the left at C7-T1. The MRI of the brain showed changes related to MS. There was no change when compared to the prior study 10/13/2012.  She had a relapse in June this year.    Gait/strength/sensation:  She has mild difficulty with her gait and balance, but no worsening.   She stumbles a lot.   She notes some spasticity in legs.   She denies much actual weakness or numbness in limbs.   Hpowever, she has dysesthesias in her feet.    Vision:   She has had mild right eye vision changes x years.  She tries to avoid driving at night now.   She denies any change in color vision. There is no eye change and no eye infections.  Bladder:    She is noting less urinary frequency and urgency on Myrbetriq/oxybutynin combo.  She ahs less incontinence than oxybutynin alone.   She still wears pads.     She has no hesitancy.     Fatigue/sleep:    She notes fatigue, worse some days than other.  She is exercising with her mom in a [employer] exercise class.  .  . Phentermine helped the fatigue some.   She has insomnia and worries a lot at night before she falls asleep. She has  nocturia but falls bvack asleep easily.    Lorazepam has helped her sleep.    She snores loudly and has mild daytime sleepiness.   No one has noted snorts or gasps.   She never had the PSG as she moved away  Mood/cognition:    Mood is doing ok 0---- had more depression earlier in there year.   She feels benefit of increased Prozac.   She feels anxiety is well controlled on lorazepam.   She notes mild cognitive dysfunction with reduced short-term memory, spelling and with word finding. Focus and attention are better on Phentermine.    Knee Pain:   Knee pain is much better since knee injection earlier in the year.  Tramadol helps some when it acts up.     MS History:   She began to experience difficulties with her gait in December 2010. She felt the onset of gait disorder was fairly sudden. She noted bilateral leg clumsiness also noted that her handwriting was worse (right-handed). An MRI of the brain was performed on 02/12/2010 showed multiple white matter foci including one enhancing lesion in the left posterior limb of the internal capsule. Lesions were infratentorial and supratentorial with many in the periventricular white matter. Of note, in 2009 she had noted numbness in one of her legs that lasted for 2 or 3  weeks after she was placed on a muscle relaxant. I saw her on 12/28/2009 and felt the MRI was very consistent with MS. She received a 3 day course of IV steroids and was feeling better by the third day. She chose to start Rebif and tolerated it fairly well when she was reevaluated 6 weeks later.   Repeat MRI's of the brain 01/2011 and 10/13/2012 showed no new foci.   She hasd an exacerbation with reduced gait and worsening bladder function June 2017.      REVIEW OF SYSTEMS: Constitutional: No fevers, chills, sweats, or change in appetite.  She notes fatigue. She sleeps well at night. Eyes: No visual changes, double vision, eye pain Ear, nose and throat: No hearing loss, ear pain, nasal  congestion, sore throat Cardiovascular: No chest pain, palpitations Respiratory: No shortness of breath at rest or with exertion.   No wheezes.  She snores GastrointestinaI: No nausea, vomiting, diarrhea, abdominal pain, fecal incontinence Genitourinary: No dysuria, urinary retention.   Severe urinary frequency with nocturia.  Recent incontinence has been worse. Musculoskeletal: No neck pain, back pain.   She is having less left knee pain Integumentary: No rash, pruritus, skin lesions Neurological: as above.    Has occipital headaches at times, worse when has busier day.    Psychiatric: Notes depression and anxiety Endocrine: No palpitations, diaphoresis, change in appetite, change in weigh or increased thirst Hematologic/Lymphatic: No anemia, purpura, petechiae. Allergic/Immunologic: No itchy/runny eyes, nasal congestion, recent allergic reactions, rashes  ALLERGIES: No Known Allergies  HOME MEDICATIONS:  Current Outpatient Prescriptions:  .  bisoprolol-hydrochlorothiazide (ZIAC) 10-6.25 MG per tablet, Take 2 tablets by mouth daily., Disp: 60 tablet, Rfl: 5 .  cholecalciferol (VITAMIN D) 1000 UNITS tablet, Take 2,000 Units by mouth daily., Disp: , Rfl:  .  FLUoxetine (PROZAC) 40 MG capsule, , Disp: , Rfl: 0 .  fluticasone (FLONASE) 50 MCG/ACT nasal spray, , Disp: , Rfl:  .  Interferon Beta-1a (REBIF REBIDOSE) 44 MCG/0.5ML SOAJ, Inject 44 mcg into the skin 3 (three) times a week., Disp: 36 Syringe, Rfl: 3 .  ipratropium (ATROVENT) 0.03 % nasal spray, Place 2 sprays into the nose 2 (two) times daily., Disp: 30 mL, Rfl: 0 .  levothyroxine (SYNTHROID, LEVOTHROID) 25 MCG tablet, Take 25 mcg by mouth daily before breakfast., Disp: , Rfl:  .  lisinopril (PRINIVIL,ZESTRIL) 10 MG tablet, Take 1 tablet (10 mg total) by mouth daily., Disp: 30 tablet, Rfl: 0 .  LORazepam (ATIVAN) 0.5 MG tablet, Take 1 tablet (0.5 mg total) by mouth 2 (two) times daily as needed for anxiety., Disp: 180 tablet,  Rfl: 1 .  mirabegron ER (MYRBETRIQ) 50 MG TB24 tablet, Take 1 tablet (50 mg total) by mouth daily., Disp: 90 tablet, Rfl: 3 .  Multiple Vitamin (MULTIVITAMIN) tablet, Take 1 tablet by mouth daily., Disp: , Rfl:  .  omeprazole (PRILOSEC) 20 MG capsule, , Disp: , Rfl:  .  oxybutynin (DITROPAN) 5 MG tablet, Take 1 tablet (5 mg total) by mouth 3 (three) times daily., Disp: 60 tablet, Rfl: 11 .  phentermine (ADIPEX-P) 37.5 MG tablet, Take 1 tablet (37.5 mg total) by mouth daily before breakfast., Disp: 30 tablet, Rfl: 5 .  traMADol (ULTRAM) 50 MG tablet, Take 1 tablet (50 mg total) by mouth every 6 (six) hours as needed., Disp: 90 tablet, Rfl: 3 .  topiramate (TOPAMAX) 100 MG tablet, Take 1 tablet (100 mg total) by mouth 2 (two) times daily. (Patient not taking: Reported on 10/08/2016), Disp: 180  tablet, Rfl: 3  PAST MEDICAL HISTORY: Past Medical History:  Diagnosis Date  . Depression   . Headache   . Hyperlipidemia   . Hypertension   . MS (multiple sclerosis) (HCC) 2011  . Vision abnormalities     PAST SURGICAL HISTORY: No past surgical history on file.  FAMILY HISTORY: Family History  Problem Relation Age of Onset  . Hypertension Mother   . Hypertension Father   . Stroke Father   . Diabetes Father   . Heart disease Maternal Grandmother   . Cancer Maternal Grandfather   . Heart disease Paternal Grandmother   . Asthma Paternal Grandfather     SOCIAL HISTORY:  Social History   Social History  . Marital status: Single    Spouse name: N/A  . Number of children: 0  . 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 Smoking status: Never Smoker  . Smokeless tobacco: Never Used  . Alcohol use No  . Drug use: No  . Sexual activity: Yes    Partners: Male    Birth control/ protection: Condom   Other Topics Concern  . Not on file   Social History Narrative   Lives alone.     PHYSICAL  EXAM  Vitals:   10/08/16 0835  BP: 126/78  Pulse: 78  Resp: 18  Weight: 236 lb (107 kg)  Height: 5\' 4"  (1.626 m)    Body mass index is 40.51 kg/m.   General: The patient is well-developed and well-nourished and in no acute distress  Musculoskeletal:  Back is nontender.   She is now non-tender at the left knee.  Neurologic Exam  Mental status: The patient is alert and oriented x 3 at the time of the examination. The patient has apparent normal recent and remote memory, with an apparently normal attention span and concentration ability.   Speech is normal.  Cranial nerves: Extraocular movements are full. Pupils  Show 1+ right APD.  Decreased OD color vision.    There is good facial sensation to soft touch bilaterally.Facial strength is normal.  Trapezius and sternocleidomastoid strength is normal. No dysarthria is noted.  The tongue is midline, and the patient has symmetric elevation of the soft palate. No obvious hearing deficits are noted.  Motor:  Muscle bulk is normal.   Tone is increased in legs.. Strength is  5 / 5 in all 4 extremities.   Sensory: Sensory testing is intact to pinprick, soft touch and vibration sensation in all 4 extremities.  Coordination: Cerebellar testing reveals good finger-nose-finger and slight reduced heel-to-shin bilaterally.  Gait and station: Station is normal.   Gait is mildly wide. Tandem gait is wide. Romberg is negative.   Reflexes: Deep tendon reflexes are symmetric and normal bilaterally.   Plantar responses are flexor.    DIAGNOSTIC DATA (LABS, IMAGING, TESTING) - I reviewed patient records, labs, notes, testing and imaging myself where available.     ASSESSMENT AND PLAN  Multiple sclerosis (HCC)  Other fatigue  Ataxic gait  Other headache syndrome  Chronic pain of left knee  Optic neuritis   1.   Continue Rebif for MS.     2.   Continue phentermine for weight loss, attention deficit and fatigue.   3.  Continue  combination of Myrbetriq's plus oxybutynin, she is doing better.  4.    She will return to see me in 6 months if  stable or call earlier if she has new or worsening neurologic symptoms.    Richard A. Epimenio Foot, MD, PhD 10/08/2016, 12:48 PM Certified in Neurology, Clinical Neurophysiology, Sleep Medicine, Pain Medicine and Neuroimaging  [facility] [address] [place], [place] [address] [phone]

## 2016-10-10 ENCOUNTER — Ambulatory Visit: Payer: Medicare PPO | Admitting: Neurology

## 2016-10-19 ENCOUNTER — Ambulatory Visit: Payer: Medicare PPO | Admitting: Neurology

## 2016-11-25 ENCOUNTER — Other Ambulatory Visit: Payer: Self-pay | Admitting: Neurology

## 2017-03-26 ENCOUNTER — Telehealth: Payer: Self-pay | Admitting: *Deleted

## 2017-03-26 MED ORDER — INTERFERON BETA-1A 44 MCG/0.5ML ~~LOC~~ SOAJ: 44.0000 ug | SUBCUTANEOUS | 3 refills | Status: DC

## 2017-03-26 NOTE — Telephone Encounter (Signed)
Rebif escribed to [facility] per faxed request/fim

## 2017-04-08 ENCOUNTER — Ambulatory Visit: Payer: Medicare PPO | Admitting: Neurology

## 2017-05-21 ENCOUNTER — Telehealth: Payer: Self-pay | Admitting: *Deleted

## 2017-05-21 NOTE — Telephone Encounter (Signed)
LMOM for pt. to call.  She has an appt. with RAS on 06/20/17.  He is going to be out of the office that day.  I need her to call to r/s her appt./fim

## 2017-05-21 NOTE — Telephone Encounter (Signed)
appt scheduled for 8/13. Patient works Wed-Thurs, she needed a Monday or Tuesday appt.

## 2017-05-22 NOTE — Telephone Encounter (Signed)
Noted/fim 

## 2017-06-20 ENCOUNTER — Ambulatory Visit: Payer: Medicare PPO | Admitting: Neurology

## 2017-06-21 ENCOUNTER — Other Ambulatory Visit: Payer: Self-pay | Admitting: Neurology

## 2017-06-28 ENCOUNTER — Telehealth: Payer: Self-pay | Admitting: Neurology

## 2017-06-28 NOTE — Telephone Encounter (Signed)
I have spoken with Joanne Parker and explained that Phentermine was faxed to [facility] in [place] on 06/21/17.  She will check with the pharmacy/fim

## 2017-06-28 NOTE — Telephone Encounter (Signed)
Pt calling re: her phentermine (ADIPEX-P) 37.5 MG tablet, she said a few days ago she was told by the pharmacy that the refill request was denied.  Pt would like for this to be filled and is asking for a call back re: this please

## 2017-07-04 ENCOUNTER — Other Ambulatory Visit: Payer: Self-pay | Admitting: Neurology

## 2017-08-05 ENCOUNTER — Ambulatory Visit: Payer: Medicare PPO | Admitting: Neurology

## 2017-08-05 ENCOUNTER — Telehealth: Payer: Self-pay | Admitting: Neurology

## 2017-08-05 NOTE — Telephone Encounter (Signed)
Pt is asking for a call from RN Faith

## 2017-08-05 NOTE — Telephone Encounter (Signed)
I have spoken with Joanne Parker this afternoon.  She c/o increased stress/anxiety related to recent premature death of her nephew. No SI/HI.  I have explained RAS is ooo this wk. and have offered either referral now to behavioral health, or appt. with RAS next wk.  Pt. declined both of these options at this time but will call back if she changes her mind/fim

## 2017-08-19 ENCOUNTER — Ambulatory Visit (INDEPENDENT_AMBULATORY_CARE_PROVIDER_SITE_OTHER): Payer: Medicare PPO | Admitting: Neurology

## 2017-08-19 ENCOUNTER — Encounter: Payer: Self-pay | Admitting: Neurology

## 2017-08-19 VITALS — BP 144/79 | HR 57 | Resp 18 | Ht 64.0 in | Wt 220.5 lb

## 2017-08-19 DIAGNOSIS — R260 Ataxic gait: Secondary | ICD-10-CM

## 2017-08-19 DIAGNOSIS — R5383 Other fatigue: Secondary | ICD-10-CM

## 2017-08-19 DIAGNOSIS — F329 Major depressive disorder, single episode, unspecified: Secondary | ICD-10-CM

## 2017-08-19 DIAGNOSIS — G35 Multiple sclerosis: Secondary | ICD-10-CM

## 2017-08-19 DIAGNOSIS — R350 Frequency of micturition: Secondary | ICD-10-CM

## 2017-08-19 DIAGNOSIS — F32A Depression, unspecified: Secondary | ICD-10-CM

## 2017-08-19 DIAGNOSIS — H469 Unspecified optic neuritis: Secondary | ICD-10-CM

## 2017-08-19 MED ORDER — TRAMADOL HCL 50 MG PO TABS: 50.0000 mg | ORAL_TABLET | Freq: Four times a day (QID) | ORAL | 5 refills | Status: DC | PRN

## 2017-08-19 MED ORDER — TRAZODONE HCL 100 MG PO TABS: 100.0000 mg | ORAL_TABLET | Freq: Every day | ORAL | 4 refills | Status: DC

## 2017-08-19 NOTE — Progress Notes (Signed)
[facility]  PATIENT: Joanne Parker DOB: 01-02-1961  REFERRING DOCTOR OR PCP:  Garner Nash  SOURCE: Patient  _________________________________   HISTORICAL  CHIEF COMPLAINT:  Chief Complaint  Patient presents with  . Multiple Sclerosis    Sts. she continues to tolerate Rebif well.  Denies new or worsening sx. Sts. she is having some more depression since recent death of her nephew at age 56.  Denies SI/HI/fim    HISTORY OF PRESENT ILLNESS:  Joanne Parker is a 56 year old woman with MS diagnosed in 2011.      Gait/strength/sensation:  She continues to have some difficulty with her gait. She stumbles a lot but has no recent falls. She has some spasticity in her legs and the gait is a little wide.  She denies much actual weakness or numbness in limbs.   She getd numbness in her hands and feet,     MRI of the brain and cervical spine was performed 07/02/2016. The MRI of the cervical spine shows a subtle focus towards the left at C7-T1. The MRI of the brain showed changes related to MS. There was no change when compared to the prior study 10/13/2012.  She had a relapse in June 2017.    Vision:   She notes mild decreased visual acuity out of the right eye. However, color vision is symmetric. There is no eye change and no eye infections.  Bladder:    She still has a lot of frequency and urgency.   She has nocturia up to 6 times a night. She does better on oxybutynin and myrbetriq.   She has occasioanl incontinence.  She still wears pads.     She has no hesitancy.     Fatigue/sleep:   Fatigue is more for problem and she notes that she has not been sleeping as well.. Phentermine helped the fatigue some.   She has trouble falling asleep and staying asleep.    Lorazepam has helped her sleep in the past but less since her nephew died.    She snores loudly and has mild daytime sleepiness.   No one has noted snorts or gasps.   She never had the PSG as she moved  away  Mood/cognition:    Mood is doing worse with her nephew's murder.    She feels benefit of increased Prozac.   She feels anxiety is well controlled on lorazepam.   She notes mild cognitive dysfunction with reduced short-term memory, spelling and with word finding. Focus and attention are better on Phentermine.    Weight:   She has lost some weight on phentermine and topiramate.    MS History:   She began to experience difficulties with her gait in December 2010. She felt the onset of gait disorder was fairly sudden. She noted bilateral leg clumsiness also noted that her handwriting was worse (right-handed). An MRI of the brain was performed on 02/12/2010 showed multiple white matter foci including one enhancing lesion in the left posterior limb of the internal capsule. Lesions were infratentorial and supratentorial with many in the periventricular white matter. Of note, in 2009 she had noted numbness in one of her legs that lasted for 2 or 3 weeks after she was placed on a muscle relaxant. I saw her on 12/28/2009 and felt the MRI was very consistent with MS. She received a 3 day course of IV steroids and was feeling better by the third day. She chose to start Rebif and tolerated it fairly well  when she was reevaluated 6 weeks later.   Repeat MRI's of the brain 01/2011 and 10/13/2012 showed no new foci.   She hasd an exacerbation with reduced gait and worsening bladder function June 2017.      REVIEW OF SYSTEMS: Constitutional: No fevers, chills, sweats, or change in appetite.  She notes fatigue. She sleeps well at night. Eyes: No visual changes, double vision, eye pain Ear, nose and throat: No hearing loss, ear pain, nasal congestion, sore throat Cardiovascular: No chest pain, palpitations Respiratory: No shortness of breath at rest or with exertion.   No wheezes.  She snores GastrointestinaI: No nausea, vomiting, diarrhea, abdominal pain, fecal incontinence Genitourinary: No dysuria, urinary  retention.   Severe urinary frequency with nocturia.  Recent incontinence has been worse. Musculoskeletal: No neck pain, back pain.   She is having less left knee pain Integumentary: No rash, pruritus, skin lesions Neurological: as above.    Has occipital headaches at times, worse when has busier day.    Psychiatric: Notes depression and anxiety Endocrine: No palpitations, diaphoresis, change in appetite, change in weigh or increased thirst Hematologic/Lymphatic: No anemia, purpura, petechiae. Allergic/Immunologic: No itchy/runny eyes, nasal congestion, recent allergic reactions, rashes  ALLERGIES: No Known Allergies  HOME MEDICATIONS:  Current Outpatient Prescriptions:  .  bisoprolol-hydrochlorothiazide (ZIAC) 10-6.25 MG per tablet, Take 2 tablets by mouth daily., Disp: 60 tablet, Rfl: 5 .  cholecalciferol (VITAMIN D) 1000 UNITS tablet, Take 2,000 Units by mouth daily., Disp: , Rfl:  .  FLUoxetine (PROZAC) 40 MG capsule, , Disp: , Rfl: 0 .  fluticasone (FLONASE) 50 MCG/ACT nasal spray, , Disp: , Rfl:  .  Interferon Beta-1a (REBIF REBIDOSE) 44 MCG/0.5ML SOAJ, Inject 44 mcg into the skin 3 (three) times a week., Disp: 36 Syringe, Rfl: 3 .  levothyroxine (SYNTHROID, LEVOTHROID) 25 MCG tablet, Take 25 mcg by mouth daily before breakfast., Disp: , Rfl:  .  lisinopril (PRINIVIL,ZESTRIL) 10 MG tablet, Take 1 tablet (10 mg total) by mouth daily., Disp: 30 tablet, Rfl: 0 .  LORazepam (ATIVAN) 0.5 MG tablet, take 1 tablet by mouth twice a day if needed, Disp: 180 tablet, Rfl: 1 .  Multiple Vitamin (MULTIVITAMIN) tablet, Take 1 tablet by mouth daily., Disp: , Rfl:  .  MYRBETRIQ 50 MG TB24 tablet, TAKE 1 TABLET EVERY DAY, Disp: 90 tablet, Rfl: 3 .  omeprazole (PRILOSEC) 20 MG capsule, , Disp: , Rfl:  .  oxybutynin (DITROPAN) 5 MG tablet, Take 1 tablet (5 mg total) by mouth 3 (three) times daily., Disp: 60 tablet, Rfl: 11 .  phentermine (ADIPEX-P) 37.5 MG tablet, take 1 tablet by mouth daily before  BREAKFAST, Disp: 30 tablet, Rfl: 5 .  topiramate (TOPAMAX) 100 MG tablet, TAKE 1 TABLET TWICE DAILY, Disp: 180 tablet, Rfl: 3 .  traMADol (ULTRAM) 50 MG tablet, Take 1 tablet (50 mg total) by mouth every 6 (six) hours as needed., Disp: 90 tablet, Rfl: 5 .  traZODone (DESYREL) 100 MG tablet, Take 1 tablet (100 mg total) by mouth at bedtime., Disp: 90 tablet, Rfl: 4  PAST MEDICAL HISTORY: Past Medical History:  Diagnosis Date  . Depression   . Headache   . Hyperlipidemia   . Hypertension   . MS (multiple sclerosis) (HCC) 2011  . Vision abnormalities     PAST SURGICAL HISTORY: No past surgical history on file.  FAMILY HISTORY: Family History  Problem Relation Age of Onset  . Hypertension Mother   . Hypertension Father   . Stroke Father   .  Diabetes Father   . Heart disease Maternal Grandmother   . Cancer Maternal Grandfather   . Heart disease Paternal Grandmother   . Asthma Paternal Grandfather     SOCIAL HISTORY:  Social History   Social History  . Marital status: Single    Spouse name: N/A  . Number of children: 0  . Years of education: 51   Occupational History  . 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 Smoking status: Never Smoker  . Smokeless tobacco: Never Used  . Alcohol use No  . Drug use: No  . Sexual activity: Yes    Partners: Male    Birth control/ protection: Condom   Other Topics Concern  . Not on file   Social History Narrative   Lives alone.     PHYSICAL EXAM  Vitals:   08/19/17 0817  BP: (!) 144/79  Pulse: (!) 57  Resp: 18  Weight: 220 lb 8 oz (100 kg)  Height: 5\' 4"  (1.626 m)    Body mass index is 37.85 kg/m.   General: The patient is well-developed and well-nourished and in no acute distress  Musculoskeletal:  Back is nontender.   She is now non-tender at the left knee.  Neurologic Exam  Mental status: The patient is alert and oriented x 3 at the time of the examination. The patient has  apparent normal recent and remote memory, with an apparently normal attention span and concentration ability.   Speech is normal.  Cranial nerves: Extraocular movements are full. She has a 1+ affernet pupillary defect on the right    facial strength and sensation is normal. Trapezius strength is normal.. No dysarthria is noted.  The tongue is midline, and the patient has symmetric elevation of the soft palate. No obvious hearing deficits are noted.  Motor:  Muscle bulk is normal.   Tone is increased in legs.. Strength is  5 / 5 in all 4 extremities.   Sensory: He has intact sensation to touch temperature and vibration in the arms and legs..  Coordination: Cerebellar testing reveals good finger-nose-finger and slight reduced heel-to-shin bilaterally.  Gait and station: Station is normal.   Gait is mildly wide. Tandem gait is wide. Romberg is negative.   Reflexes: Deep tendon reflexes are symmetric and normal bilaterally.       DIAGNOSTIC DATA (LABS, IMAGING, TESTING) - I reviewed patient records, labs, notes, testing and imaging myself where available.     ASSESSMENT AND PLAN  Multiple sclerosis (HCC)  Depression, unspecified depression type  Optic neuritis  Urinary frequency  Other fatigue  Ataxic gait   1.  She will continue the Rebif for multiple sclerosis.  2.   Continue phentermine for weight loss, attention deficit and fatigue.   3.   Add trazodone to help sleep. 4.   Continue bladder medications. 5.   She will return to see me in 6 months if stable or call earlier if she has new or worsening neurologic symptoms.    Tavoris Brisk A. Epimenio Foot, MD, PhD 08/19/2017, 2:43 PM Certified in Neurology, Clinical Neurophysiology, Sleep Medicine, Pain Medicine and Neuroimaging  [facility] [address], [address] [place], [place] [address] [phone]

## 2017-08-27 ENCOUNTER — Telehealth: Payer: Self-pay | Admitting: Neurology

## 2017-08-27 NOTE — Telephone Encounter (Signed)
Pt calling re: a fax that is to come to Dr Epimenio Foot from [employer].  Pt states  She was informed today would be the 2nd time they would be faxing this to the attention of Dr Epimenio Foot for verification that pt has MS.  This form needs to be sent back to them by 9-16 or pt will loose assistance.  Pt is asking for a call back about this

## 2017-08-28 NOTE — Telephone Encounter (Signed)
LMOM that I have not received any pt. assistance paperwork for Kerri-Anne.  Asked that she confirm fax# they are sending it to--should send it to [phone]/fim

## 2017-09-09 ENCOUNTER — Telehealth: Payer: Self-pay | Admitting: Neurology

## 2017-09-09 NOTE — Telephone Encounter (Signed)
Try [phone] as being the number that came across caller ID. Sorry for the confusion.

## 2017-09-09 NOTE — Telephone Encounter (Signed)
The number she gave me was [phone] is this number not working?

## 2017-09-09 NOTE — Telephone Encounter (Signed)
Spoke with [employer].  They will fax PA request to me fax# [phone]/fim

## 2017-09-09 NOTE — Telephone Encounter (Signed)
Joanne Parker with [employer] called office to advise RN that paperwork for PA for the Rebif has not been received. FYI

## 2017-09-11 ENCOUNTER — Telehealth: Payer: Self-pay | Admitting: *Deleted

## 2017-09-11 NOTE — Telephone Encounter (Signed)
[email or web address]@ [employer] calling for an update, I provided her with the last entry from RN Faith on 9-17, she states they will refax and is asking for a call to confirm it has been received [phone]

## 2017-09-11 NOTE — Telephone Encounter (Signed)
PA for Rebif Rebidose 70mcg/0.5ml #36/90 completed by phone with [facility], phone# [phone]. Pt. has been on Rebif since dx. with RRMS in 2011, is clinically stable on Rebif/fim

## 2017-09-11 NOTE — Telephone Encounter (Signed)
PA form received.  PA completed by phone with [employer]. See PA note/fim

## 2017-09-12 NOTE — Telephone Encounter (Signed)
Fax received from [facility] (phone# [phone]).  Rebif approved until 09/12/19. No PA#. Copy of approval letter faxed to [employer], fax# [phone]/fim

## 2017-12-30 ENCOUNTER — Telehealth: Payer: Self-pay | Admitting: Neurology

## 2017-12-30 NOTE — Telephone Encounter (Signed)
Patient calling stating needs PA for Interferon Beta-1a (REBIF REBIDOSE) 44 MCG/0.5ML SOA.

## 2017-12-30 NOTE — Telephone Encounter (Signed)
PA for Rebif was done in September, with approval received thru 09/12/19.  I called [employer] today, completed another PA. Pt. has been on Rebif since dx. in 2010. MS has been clinically stable on Rebif.  Dx: G35 (RRMS).  Ref# for call [phone]

## 2018-01-08 NOTE — Telephone Encounter (Signed)
Fax received from [facility] (phone# [phone]).  Rebif PA approved thru 12/23/18.  Member ID: [id number].  No PA# given/fim

## 2018-02-17 ENCOUNTER — Encounter: Payer: Self-pay | Admitting: Neurology

## 2018-02-17 ENCOUNTER — Other Ambulatory Visit: Payer: Self-pay

## 2018-02-17 ENCOUNTER — Ambulatory Visit: Payer: Medicare PPO | Admitting: Neurology

## 2018-02-17 VITALS — BP 158/79 | HR 56 | Ht 64.0 in | Wt 233.0 lb

## 2018-02-17 DIAGNOSIS — G4719 Other hypersomnia: Secondary | ICD-10-CM | POA: Diagnosis not present

## 2018-02-17 DIAGNOSIS — G35 Multiple sclerosis: Secondary | ICD-10-CM | POA: Diagnosis not present

## 2018-02-17 DIAGNOSIS — R5383 Other fatigue: Secondary | ICD-10-CM | POA: Diagnosis not present

## 2018-02-17 DIAGNOSIS — F329 Major depressive disorder, single episode, unspecified: Secondary | ICD-10-CM

## 2018-02-17 DIAGNOSIS — R260 Ataxic gait: Secondary | ICD-10-CM

## 2018-02-17 DIAGNOSIS — R0683 Snoring: Secondary | ICD-10-CM | POA: Diagnosis not present

## 2018-02-17 DIAGNOSIS — F32A Depression, unspecified: Secondary | ICD-10-CM

## 2018-02-17 DIAGNOSIS — R350 Frequency of micturition: Secondary | ICD-10-CM

## 2018-02-17 MED ORDER — DULOXETINE HCL 60 MG PO CPEP: 60.0000 mg | ORAL_CAPSULE | Freq: Every day | ORAL | 3 refills | Status: DC

## 2018-02-17 MED ORDER — OXYBUTYNIN CHLORIDE 5 MG PO TABS: 5.0000 mg | ORAL_TABLET | Freq: Two times a day (BID) | ORAL | 4 refills | Status: DC

## 2018-02-17 MED ORDER — PHENTERMINE HCL 37.5 MG PO TABS: 37.5000 mg | ORAL_TABLET | Freq: Every day | ORAL | 1 refills | Status: DC

## 2018-02-17 MED ORDER — MIRABEGRON ER 50 MG PO TB24: 50.0000 mg | ORAL_TABLET | Freq: Every day | ORAL | 3 refills | Status: DC

## 2018-02-17 MED ORDER — LORAZEPAM 1 MG PO TABS: ORAL_TABLET | ORAL | 1 refills | Status: DC

## 2018-02-17 NOTE — Progress Notes (Signed)
[facility]  PATIENT: Joanne Parker DOB: 01/28/1961  REFERRING DOCTOR OR PCP:  Garner Nash  SOURCE: Patient  _________________________________   HISTORICAL  CHIEF COMPLAINT:  Chief Complaint  Patient presents with  . Multiple Sclerosis    Sts. she continues to tolerate Rebif well.  Stopped Topamax for h/a b/c she felt overall bad (unable to be more specific) on it.  Stopped Trazodone for sleep due to next day drowsiness/fim    HISTORY OF PRESENT ILLNESS:  Joanne Parker is a 57 year old woman with MS diagnosed in 2011.      Date 02/17/2018:   She continues on Rebif for her multiple sclerosis. She feels that she has been stable no new neurologic symptoms. She has some spasticity in her legs and a mildly spastic gait. She occasionally will have some numbness in the hands and feet. She denies any change in vision but continues to note mild reduced vision out of the right eye. She has difficulty with her bladder emptying frequency and urgency with nocturia.   Myrbetriq and oxybutynin have helped some.   She uses takes oxybutynin bid as dry mouth worse with tid.     She notes some difficulty with fatigue but feels that phentermine is helping some. Phentermine has not helped weight loss much.  She continues to have insomnia but is better than last visit.  At the last visit, trazodone was added but it caused some drowsiness the next day so she stopped. She snores and has mild daytime sleepiness but never did a PSG that was ordered.   Mood worsened last year after her nephew was murdered and she feels she gets only a little benefit from Prozac even at a higher dose of 40 mg. We discussed changing to different antidepressant.   The lorazepam at night has helped her anxiety at bedtime... She has had some cognitive issues especially reduced short-term memory, spelling a word finding. Phentermine has helped some.    EPWORTH SLEEPINESS SCALE  On a scale of 0 - 3 what is the  chance of dozing:  Sitting and Reading:   1 Watching TV:    3 Sitting inactive in a public place: 0 Passenger in car for one hour: 0 Lying down to rest in the afternoon: 3 Sitting and talking to someone: 0 Sitting quietly after lunch:  3 In a car, stopped in traffic:  0  Total (out of 24): 10/24 mild excessive daytime sleepiness   From 08/19/2017: Gait/strength/sensation:  She continues to have some difficulty with her gait. She stumbles a lot but has no recent falls. She has some spasticity in her legs and the gait is a little wide.  She denies much actual weakness or numbness in limbs.   She getd numbness in her hands and feet,     MRI of the brain and cervical spine was performed 07/02/2016. The MRI of the cervical spine shows a subtle focus towards the left at C7-T1. The MRI of the brain showed changes related to MS. There was no change when compared to the prior study 10/13/2012.  She had a relapse in June 2017.    Vision:   She notes mild decreased visual acuity out of the right eye. However, color vision is symmetric. There is no eye change and no eye infections.  Bladder:    She still has a lot of frequency and urgency.   She has nocturia up to 6 times a night. She does better on oxybutynin and myrbetriq.  She has occasioanl incontinence.  She still wears pads.     She has no hesitancy.     Fatigue/sleep:   Fatigue is more for problem and she notes that she has not been sleeping as well.. Phentermine helped the fatigue some.   She has trouble falling asleep and staying asleep.    Lorazepam has helped her sleep in the past but less since her nephew died.    She snores loudly and has mild daytime sleepiness.   No one has noted snorts or gasps.   She never had the PSG as she moved away  Mood/cognition:    Mood is doing worse with her nephew's murder.    She feels benefit of increased Prozac.   She feels anxiety is well controlled on lorazepam.   She notes mild cognitive dysfunction with  reduced short-term memory, spelling and with word finding. Focus and attention are better on Phentermine.    Weight:   She has lost some weight on phentermine and topiramate.    MS History:   She began to experience difficulties with her gait in December 2010. She felt the onset of gait disorder was fairly sudden. She noted bilateral leg clumsiness also noted that her handwriting was worse (right-handed). An MRI of the brain was performed on 02/12/2010 showed multiple white matter foci including one enhancing lesion in the left posterior limb of the internal capsule. Lesions were infratentorial and supratentorial with many in the periventricular white matter. Of note, in 2009 she had noted numbness in one of her legs that lasted for 2 or 3 weeks after she was placed on a muscle relaxant. I saw her on 12/28/2009 and felt the MRI was very consistent with MS. She received a 3 day course of IV steroids and was feeling better by the third day. She chose to start Rebif and tolerated it fairly well when she was reevaluated 6 weeks later.   Repeat MRI's of the brain 01/2011 and 10/13/2012 showed no new foci.   She hasd an exacerbation with reduced gait and worsening bladder function June 2017.      REVIEW OF SYSTEMS: Constitutional: No fevers, chills, sweats, or change in appetite.  She notes fatigue. She sleeps well at night. Eyes: No visual changes, double vision, eye pain Ear, nose and throat: No hearing loss, ear pain, nasal congestion, sore throat Cardiovascular: No chest pain, palpitations Respiratory: No shortness of breath at rest or with exertion.   No wheezes.  She snores GastrointestinaI: No nausea, vomiting, diarrhea, abdominal pain, fecal incontinence Genitourinary: No dysuria, urinary retention.   Severe urinary frequency with nocturia.  Recent incontinence has been worse. Musculoskeletal: No neck pain, back pain.   She is having less left knee pain Integumentary: No rash, pruritus, skin  lesions Neurological: as above.    Has occipital headaches at times, worse when has busier day.    Psychiatric: Notes depression and anxiety Endocrine: No palpitations, diaphoresis, change in appetite, change in weigh or increased thirst Hematologic/Lymphatic: No anemia, purpura, petechiae. Allergic/Immunologic: No itchy/runny eyes, nasal congestion, recent allergic reactions, rashes  ALLERGIES: No Known Allergies  HOME MEDICATIONS:  Current Outpatient Medications:  .  bisoprolol-hydrochlorothiazide (ZIAC) 10-6.25 MG per tablet, Take 2 tablets by mouth daily., Disp: 60 tablet, Rfl: 5 .  cholecalciferol (VITAMIN D) 1000 UNITS tablet, Take 2,000 Units by mouth daily., Disp: , Rfl:  .  fluticasone (FLONASE) 50 MCG/ACT nasal spray, , Disp: , Rfl:  .  Interferon Beta-1a (REBIF REBIDOSE) 44 MCG/0.5ML  SOAJ, Inject 44 mcg into the skin 3 (three) times a week., Disp: 36 Syringe, Rfl: 3 .  levothyroxine (SYNTHROID, LEVOTHROID) 25 MCG tablet, Take 25 mcg by mouth daily before breakfast., Disp: , Rfl:  .  lisinopril (PRINIVIL,ZESTRIL) 10 MG tablet, Take 1 tablet (10 mg total) by mouth daily., Disp: 30 tablet, Rfl: 0 .  LORazepam (ATIVAN) 1 MG tablet, Tale 1.2 to 1 at night, Disp: 90 tablet, Rfl: 1 .  mirabegron ER (MYRBETRIQ) 50 MG TB24 tablet, Take 1 tablet (50 mg total) by mouth daily., Disp: 90 tablet, Rfl: 3 .  Multiple Vitamin (MULTIVITAMIN) tablet, Take 1 tablet by mouth daily., Disp: , Rfl:  .  omeprazole (PRILOSEC) 20 MG capsule, , Disp: , Rfl:  .  oxybutynin (DITROPAN) 5 MG tablet, Take 1 tablet (5 mg total) by mouth 2 (two) times daily., Disp: 180 tablet, Rfl: 4 .  phentermine (ADIPEX-P) 37.5 MG tablet, Take 1 tablet (37.5 mg total) by mouth daily before breakfast., Disp: 90 tablet, Rfl: 1 .  traMADol (ULTRAM) 50 MG tablet, Take 1 tablet (50 mg total) by mouth every 6 (six) hours as needed., Disp: 90 tablet, Rfl: 5 .  DULoxetine (CYMBALTA) 60 MG capsule, Take 1 capsule (60 mg total) by mouth  daily., Disp: 90 capsule, Rfl: 3 .  topiramate (TOPAMAX) 100 MG tablet, TAKE 1 TABLET TWICE DAILY (Patient not taking: Reported on 02/17/2018), Disp: 180 tablet, Rfl: 3 .  traZODone (DESYREL) 100 MG tablet, Take 1 tablet (100 mg total) by mouth at bedtime., Disp: 90 tablet, Rfl: 4  PAST MEDICAL HISTORY: Past Medical History:  Diagnosis Date  . Depression   . Headache   . Hyperlipidemia   . Hypertension   . MS (multiple sclerosis) (HCC) 2011  . Vision abnormalities     PAST SURGICAL HISTORY: History reviewed. No pertinent surgical history.  FAMILY HISTORY: Family History  Problem Relation Age of Onset  . Hypertension Mother   . Hypertension Father   . Stroke Father   . Diabetes Father   . Heart disease Maternal Grandmother   . Cancer Maternal Grandfather   . Heart disease Paternal Grandmother   . Asthma Paternal Grandfather     SOCIAL HISTORY:  Social History   Socioeconomic History  . Marital status: Single    Spouse name: N/A  . Number of children: 0  . Years of education: 11  . Highest education level: Not on file  Social Needs  . Financial resource strain: Not on file  . Food insecurity - worry: Not on file  . Food insecurity - inability: Not on file  . Transportation needs - medical: Not on file  . Transportation needs - non-medical: Not on file  Occupational History  . 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 Smoking status: Never Smoker  . Smokeless tobacco: Never Used  Substance and Sexual Activity  . Alcohol use: No    Alcohol/week: 0.0 oz  . Drug use: No  . Sexual activity: Yes    Partners: Male    Birth control/protection: Condom  Other Topics Concern  . Not on file  Social History Narrative   Lives alone.     PHYSICAL EXAM  Vitals:   02/17/18 0946  BP: (!) 158/79  Pulse: (!) 56  Weight: 233 lb (105.7 kg)  Height: 5\' 4"  (1.626 m)    Body mass index is 39.99 kg/m.   General: The patient is well-developed  and well-nourished and in no acute  distress  Musculoskeletal:  Back is nontender.   She is now non-tender at the left knee.  Neurologic Exam  Mental status: The patient is alert and oriented x 3 at the time of the examination. The patient has apparent normal recent and remote memory, with an apparently normal attention span and concentration ability.   Speech is normal.  Cranial nerves: Extraocular movements are full. She has a 1+ APD on the right. However, vision was symmetric.. Color vision was symmetric. Facial strength and sensation is normal. Trapezius strength is normal. The tongue is midline, and the patient has symmetric elevation of the soft palate. No obvious hearing deficits are noted.  Motor:  Muscle bulk is normal.   The muscle tone is increased in both legs. Strength is 5/5 in the arms and legs..   Sensory: He has intact sensation to touch temperature and vibration in the arms and legs..  Coordination: Finger-nose-finger is performed well but heel-to-shin is reduced bilaterally.  Gait and station: Station is normal.   Gait is mildly wide. Tandem gait is wide. Romberg is negative.   Reflexes: Deep tendon reflexes are symmetric and normal bilaterally.       DIAGNOSTIC DATA (LABS, IMAGING, TESTING) - I reviewed patient records, labs, notes, testing and imaging myself where available.     ASSESSMENT AND PLAN  Multiple sclerosis (HCC) - Plan: Split night study  Urinary frequency  Other fatigue  Ataxic gait  Snoring - Plan: Split night study  Excessive daytime sleepiness - Plan: Split night study  Depression, unspecified depression type   1.  Repair for multiple sclerosis. Sometime later this year we will consider obtaining another MRI to determine if there are subclinical progression that would make [place] choose a different disease modifying therapy..  2.   Continue phentermine for weight loss, attention deficit and fatigue.   3.   She snores and has mild  excessive daytime sleepiness. We will try to check a sleep study to determine if she might benefit from CPAP therapy.. 4.   Continue oxybutynin and Myrbetriq. 5.   Stop the Prozac and start Cymbalta for depression which is still a problem for her.  She will return to see me in 6 months if stable or call earlier if she has new or worsening neurologic symptoms.    Sabel Hornbeck A. Epimenio Foot, MD, PhD 02/17/2018, 10:19 AM Certified in Neurology, Clinical Neurophysiology, Sleep Medicine, Pain Medicine and Neuroimaging  [address] [facility] [address], [address] [place], [place] [address] [phone]

## 2018-02-19 NOTE — Telephone Encounter (Signed)
Error

## 2018-03-24 ENCOUNTER — Ambulatory Visit (INDEPENDENT_AMBULATORY_CARE_PROVIDER_SITE_OTHER): Payer: Medicare PPO | Admitting: Neurology

## 2018-03-24 DIAGNOSIS — R0683 Snoring: Secondary | ICD-10-CM

## 2018-03-24 DIAGNOSIS — G4719 Other hypersomnia: Secondary | ICD-10-CM

## 2018-03-24 DIAGNOSIS — G4733 Obstructive sleep apnea (adult) (pediatric): Secondary | ICD-10-CM | POA: Diagnosis not present

## 2018-03-24 DIAGNOSIS — G35 Multiple sclerosis: Secondary | ICD-10-CM

## 2018-03-25 DIAGNOSIS — G4733 Obstructive sleep apnea (adult) (pediatric): Secondary | ICD-10-CM | POA: Insufficient documentation

## 2018-03-25 NOTE — Progress Notes (Signed)
PATIENT'S NAME:  Joanne Parker, Joanne Parker DOB:      07/27/1961      MR#:    [id number]     DATE OF RECORDING: 03/24/2018 REFERRING M.D.:  Garner Nash MD Study Performed:   Baseline Polysomnogram HISTORY:    She snores and has mild daytime sleepiness but never did a PSG that was ordered.   Mood worsened last year after her nephew was murdered and she feels she gets only a little benefit from Prozac even at a higher dose of 40 mg. We discussed changing to different antidepressant.   The lorazepam at night has helped her anxiety at bedtime... She has had some cognitive issues especially reduced short-term memory, spelling a word finding. Phentermine has helped some.  The patient endorsed the Epworth Sleepiness Scale at 10 points.    The patient's weight 234 pounds with a height of 64 (inches), resulting in a BMI of 39.9 kg/m2.  The patient's neck circumference measured 15.5 inches.  CURRENT MEDICATIONS: Ziac, Flonase, Synthroid, Lisinopril, Ativan, Myrbetriq, Prilosec, Ditropan, Adipex, Ultram, Cymbalta, Topamax, Desyrel   PROCEDURE:  This is a multichannel digital polysomnogram utilizing the Somnostar 11.2 system.  Electrodes and sensors were applied and monitored per AASM Specifications.   EEG, EOG, Chin and Limb EMG, were sampled at 200 Hz.  ECG, Snore and Nasal Pressure, Thermal Airflow, Respiratory Effort, CPAP Flow and Pressure, Oximetry was sampled at 50 Hz. Digital video and audio were recorded.      BASELINE STUDY  Lights Out was at 22:26 and Lights On at 04:53.  Total recording time (TRT) was 388 minutes, with a total sleep time (TST) of  308 minutes.   The patient's sleep latency was 26.5 minutes.  REM latency was 168 minutes.  The sleep efficiency was 79.4 %.     SLEEP ARCHITECTURE: WASO (Wake after sleep onset) was 57 minutes.  There were 38.5 minutes in Stage N1, 167 minutes Stage N2, 55.5 minutes Stage N3 and 47 minutes in Stage REM.  The percentage of Stage N1 was 12.5%, Stage N2 was  54.2%, Stage N3 was 18.% and Stage R (REM sleep) was 15.3%.   The arousals were noted as: 46 were spontaneous, 0 were associated with PLMs, 2 were associated with respiratory events.    Audio and video analysis did not show any abnormal or unusual movements, behaviors, phonations or vocalizations.     EKG was in keeping with normal sinus rhythm (NSR).  RESPIRATORY ANALYSIS:  There were a total of 35 respiratory events:  16 obstructive apneas, 0 central apneas and 0 mixed apneas with a total of 16 apneas and an apnea index (AI) of 3.1 /hour. There were 19 hypopneas with a hypopnea index of 3.7 /hour. The patient also had 0 respiratory event related arousals (RERAs).      The total APNEA/HYPOPNEA INDEX (AHI) was 6.8/hour and the total RESPIRATORY DISTURBANCE INDEX was 6.8 /hour.  31 events occurred in REM sleep and 6 events in NREM. The REM AHI was 39.6 /hour, versus a non-REM AHI of .9. The patient spent 0 minutes of total sleep time in the supine position and 308 minutes in non-supine.. The supine AHI was 0.0 versus a non-supine AHI of 6.8.  OXYGEN SATURATION & C02:  The Wake baseline 02 saturation was 96%, with the lowest being 83%. Time spent below 89% saturation equaled 2 minutes.   PERIODIC LIMB MOVEMENTS:   The patient had a total of 0 Periodic Limb Movements.  The Periodic  Limb Movement (PLM) index was 0 and the PLM Arousal index was 0/hour. Marland Kitchen   IMPRESSION:  1.  Mild Obstructive Sleep Apnea (OSA) with a total AHI = 6.8.  However, the REM associated OSA was severe with a REM-AHI = 39.6.  The patient only slept non-supine.   RECOMMENDATIONS:  1. She has severe REM associated OSA that was mild when averaged over the entire night.   As she also has mild excessive daytime sleepiness, we can bring her back in for CPAP. 2. Alternatively, we can try weight loss and an oral appliance. 3. Follow up with Dr. Epimenio Foot.   I certify that I have reviewed the entire raw data recording prior to the  issuance of this report in accordance with the Standards of Accreditation of the American Academy of Sleep Medicine (AASM)    Shearon Balo, PhD Diplomat, American Board of Psychiatry and Neurology, Sleep Medicine      Demographics and Medical History           Name: Joanne Parker, Joanne Parker Age: 57 BMI: 39.9 Interp Physician: Despina Arias, MD  DOB: [birth year] Ht-IN: 64 CM: 163 Referred By: Garner Nash MD  Pt. Tag:  Wt-LB: 234 KG: 106 Tested By: Vivia Budge, RPSGT  Pt. #: [id number] Sex: Female Scored By: Vivia Budge, RPSGT  Bed Tag: [id number] Race: African American Occupation: ---  Indication for PS: ---   Sleep Summary    Sleep Time Statistics Minutes Hours    Time in Bed 388    6.5    Total Sleep Time 308    5.1    Total Sleep Time NREM 261    4.4    Total Sleep Time REM 47    0.8    Sleep Onset 22.5    0.4    Wake After Sleep Onset 57    1.0    Wake After Sleep Period 0.5    0.0    Latency Persistent Sleep 26.5    0.4    Sleep Efficiency 79.4 Percent    Lights out 22:26     Lights on 04:53    Sleep Disruption Events Count Index    Arousals 48 9.4    Awakenings 0 0    Arousals + Awakenings 48 9.4    REM Awakenings 0 0.0     Sleep Stage Statistics Wake N1 N2 N3 REM    Percent Stage to SPT 15.6  10.5  45.8  15.2  12.9  Percent   Sleep Period Time in Stage 57 38.5 167 55.5 47 Minutes   Latency to Stage  22.5 28 31.5 168 Minutes   Percent Stage to TST  12.5 54.2 18. 15.3 Percent   EKG Summary          EKG Statistics         Heart Rate, Wake 71 BPM  TST Epochs in HR Interval 0 < 29   Heart Rate, Steady Sleep Avg 59 BPM   332 30-59   PAC Events 0 Count   284 60-79   PVC Events 0 Count   0 80-99   Bradycardia 0 Count   0 100-119   Tachycardia 0 Count   0 120-139        0 140-159    NREM REM   0 > 160   Shortest R-R .8 .8       Longest R-R 1.2 1.2        Respiration Summary  Event Statistics Total  With Arousal  With Awakening    Count Index  Count Index   Count Index   Apneas, Total 16 3.1  0 0.0   0 0.0    Hypopneas, Total 19 3.7  2 0.4   0 0.0    Apnea + Hypopnea Index 35 6.8   2 0.4   0 0.0    Apneas, Supine 0 0     Apneas, Non Supine 16 3.1     Hypopneas, Supine 0 0     Hypopneas, Non Supine 19 3.7     % Sleep Apnea 1.3 Percent     % Sleep Hypopnea 1.8 Percent    Oximetry Statistics       SpO2, Mean Wake 96 Percent     SpO2, Minimum 83 Percent     SpO2, Max 98 Percent     SpO2, Mean 94 Percent            Desaturation Index, REM 25.5  Index     Desaturation Index, NREM 1.4  Index     Desaturation Index, Total 5.1  Index             SpO2 Intervals > 89% 80-89% 70-79% 60-69% 50-59% 40-49% 30-39% < 30%  308 Percent Sleep Time 97.2 1.5 0 0 0 0 0 0  Body Position Statistics   Back Side L Side R Side Prone    Total Sleep Time   0 164.0 164 0 144 Minutes   Percent Time to TST   0.0  53.2  53.2  0.0  46.8  Percent   Number of Events   0 20.0 20 0 15 Count   Number of Apneas   0 9 9 0 7 Count   Number of Hypopneas   0 11 11 0 8 Count   Apnea Index   0.0  3.3  3.3  0.0  2.9  Index   Hypopnea Index   0.0  4.0  4.0  0.0  3.3  Index   Apnea + Hypopnea Index   0.0  7.3  7.3  0.0  6.3  Index  Respiration Events    Non REM, Pre Rx Statistics Non Supine  Supine    Central Mixed Obstr  Central Mixed Obstr   Apneas 0 0 1  0 0 0 Count  Apneas, Minimum SpO2 0 0 92  0 0 0 Percent     Hypopneas 0 0 3  0 0 0 Count  Hypopneas, Minimum SpO2 0 0 93  0 0 0 Percent     Apnea + Hypopneas Index 0.0 0.0 0.9  0.0 0.0 0.0 Index    REM, Pre Rx Statistics Non Supine  Supine    Central Mixed Obstr  Central Mixed Obstr   Apneas 0 0 15  0 0 0 Count  Apneas, Minimum SpO2 0 0 88  0 0 0 Percent     Hypopneas 0 0 16  0 0 0 Count  Hypopneas, Minimum SpO2 0 0 90  0 0 0 Percent     Apnea + Hypopnea Index 0.0 0.0 39.5  0.0 0.0 0.0 Index  Leg Movement Summary    PLM Non REM (Incl. Wake) REM Total    No Arousal Arousal Wake No  Arousal Arousal Wake No Arousal Arousal Wake Total   Isolated 1 0 0 0 0 0 1 0 0 1    PLMS 0 0 0 0 0 0 0 0 0 0    Total 1 0 0 0 0  0 1 0 0 1  PLM Statistics PLMS Total     Count Index Count Index    PLM 0 0 1 0.2     PLM with Arousal 0 0 0 0.0     PLM, with Wake 0 0 0 0.0     PLM, Arousal + Wake 0 0.0 0 0.0     PLM, No Arousal 0 0.0  1 0.2     PLM, Non REM 0 0.0  1 0.2     PLM, REM 0 0.0  0 0.0     Technician Comments:  Pt scored per 3% hypopnea rule.  Pt displayed severe respiratory events primarily in REM sleep.  No PLMs noted.  NSR observed in EKG.  Pt slept in prone and lateral positions.  Moderately loud snoring heard.  Pt observed in sleep stages N1, N2, N3, REM.  No Nocturia.

## 2018-04-07 ENCOUNTER — Telehealth: Payer: Self-pay | Admitting: *Deleted

## 2018-04-07 DIAGNOSIS — G4733 Obstructive sleep apnea (adult) (pediatric): Secondary | ICD-10-CM

## 2018-04-07 NOTE — Telephone Encounter (Signed)
Spoke with Albin Felling and per RAS, explained that PSG showed mild OSA.  Tx. options are wt. loss and/or an oral appliance.  She verbalized understanding of same, sts. she is working on losing wt., and would like to discuss an oral appliance with a board certified dentist.  Referral placed in Epic, and faxed to Dr. Althea Grimmer, phone# [phone], fax# [phone]/fim

## 2018-04-08 ENCOUNTER — Other Ambulatory Visit: Payer: Self-pay | Admitting: *Deleted

## 2018-04-08 DIAGNOSIS — G4733 Obstructive sleep apnea (adult) (pediatric): Secondary | ICD-10-CM

## 2018-04-28 ENCOUNTER — Telehealth: Payer: Self-pay | Admitting: Neurology

## 2018-04-28 MED ORDER — INTERFERON BETA-1A 44 MCG/0.5ML ~~LOC~~ SOAJ: 44.0000 ug | SUBCUTANEOUS | 3 refills | Status: DC

## 2018-04-28 NOTE — Addendum Note (Signed)
Addended by: Candis Schatz I on: 04/28/2018 03:08 PM   Modules accepted: Orders

## 2018-04-28 NOTE — Telephone Encounter (Signed)
Patient requesting new Rx for Interferon Beta-1a (REBIF REBIDOSE) 44 MCG/0.5ML SOAJ called to [facility].

## 2018-04-28 NOTE — Telephone Encounter (Signed)
Rebif escribed to [facility] as requested/fim

## 2018-08-18 ENCOUNTER — Ambulatory Visit: Payer: Medicare PPO | Admitting: Neurology

## 2018-09-22 ENCOUNTER — Other Ambulatory Visit: Payer: Self-pay | Admitting: Neurology

## 2018-09-22 NOTE — Telephone Encounter (Signed)
Rx registry checked. Last fill date is 06/18/18 for #30. Last seen on 02/17/18.

## 2018-10-29 ENCOUNTER — Other Ambulatory Visit: Payer: Self-pay

## 2018-10-29 ENCOUNTER — Encounter: Payer: Self-pay | Admitting: Neurology

## 2018-10-29 ENCOUNTER — Telehealth: Payer: Self-pay | Admitting: Neurology

## 2018-10-29 ENCOUNTER — Telehealth: Payer: Self-pay | Admitting: *Deleted

## 2018-10-29 ENCOUNTER — Ambulatory Visit (INDEPENDENT_AMBULATORY_CARE_PROVIDER_SITE_OTHER): Payer: Medicare PPO | Admitting: Neurology

## 2018-10-29 ENCOUNTER — Encounter: Payer: Self-pay | Admitting: *Deleted

## 2018-10-29 VITALS — BP 145/81 | HR 61 | Ht 64.0 in | Wt 230.5 lb

## 2018-10-29 DIAGNOSIS — G4733 Obstructive sleep apnea (adult) (pediatric): Secondary | ICD-10-CM

## 2018-10-29 DIAGNOSIS — G35 Multiple sclerosis: Secondary | ICD-10-CM

## 2018-10-29 DIAGNOSIS — Z6839 Body mass index (BMI) 39.0-39.9, adult: Secondary | ICD-10-CM | POA: Insufficient documentation

## 2018-10-29 DIAGNOSIS — R350 Frequency of micturition: Secondary | ICD-10-CM | POA: Diagnosis not present

## 2018-10-29 DIAGNOSIS — R5383 Other fatigue: Secondary | ICD-10-CM

## 2018-10-29 DIAGNOSIS — R260 Ataxic gait: Secondary | ICD-10-CM

## 2018-10-29 DIAGNOSIS — G4719 Other hypersomnia: Secondary | ICD-10-CM

## 2018-10-29 MED ORDER — TRAMADOL HCL 50 MG PO TABS: 50.0000 mg | ORAL_TABLET | Freq: Four times a day (QID) | ORAL | 5 refills | Status: DC | PRN

## 2018-10-29 MED ORDER — PHENTERMINE HCL 37.5 MG PO TABS: ORAL_TABLET | ORAL | 1 refills | Status: DC

## 2018-10-29 MED ORDER — SERTRALINE HCL 100 MG PO TABS: 100.0000 mg | ORAL_TABLET | Freq: Every day | ORAL | 11 refills | Status: DC

## 2018-10-29 NOTE — Telephone Encounter (Signed)
[employer] [id number] (exp. 10/29/18 to 11/28/18) order sent to [facility]. They will reach out to the pt to schedule.

## 2018-10-29 NOTE — Telephone Encounter (Signed)
Patient is aware if she has not heard in the next 2-3 business days to give them a call.

## 2018-10-29 NOTE — Telephone Encounter (Signed)
PA submitted. Waiting on determination.  BMI: 39.55

## 2018-10-29 NOTE — Telephone Encounter (Signed)
Faxed printed/signed rx tramadol to [facility] at [phone]. Received fax confirmation.

## 2018-10-29 NOTE — Progress Notes (Signed)
[facility]  PATIENT: Joanne Parker DOB: [birth year]  REFERRING DOCTOR OR PCP:  Garner Nash  SOURCE: Patient  _________________________________   HISTORICAL  CHIEF COMPLAINT:  Chief Complaint  Patient presents with  . Follow-up    RM 12 with mother. Last seen 02/17/18.   . Multiple Sclerosis    On Rebif. Doing well on this, no SE  . Sleep Apnea    Never heard from Dr. Myrtis Ser office to schedule appt for oral appliance fitting for mild OSA.   Marland Kitchen Pain    Still having intermittent pain on bottom of feet.     HISTORY OF PRESENT ILLNESS:  Joanne Parker is a 57 y.o. woman with MS diagnosed in 2011.     Update 10/29/2018 Her MS is stable and she has no exacerbations.    She is tolerating Rebif well.  She denies significant injection reactions.   She had foot pain and a possible infection in her foot.   She is getting set up for an ultrasound to make sure circulation is good.     Her gait is about the same.  Strength and sensation are ok and both legs are symmetric.    She is on Oxybutynin and Myrbetriq with better bladder control.    PSG April 2019 showed mild overall OSA that was severe during REM. She has mild excessive daytime sleepiness.    Options were CPAP vs. Weight loss and oral appliance and she chose the latter.  A referral was made to a dentist but she never was seen.    Phentermine has helped her fatigue but weight has not been helped much.    She is depressed since her nephew died.    She can't afford   Date 02/17/2018:   She continues on Rebif for her multiple sclerosis. She feels that she has been stable no new neurologic symptoms. She has some spasticity in her legs and a mildly spastic gait. She occasionally will have some numbness in the hands and feet. She denies any change in vision but continues to note mild reduced vision out of the right eye. She has difficulty with her bladder emptying frequency and urgency with nocturia.   Myrbetriq and  oxybutynin have helped some.   She uses takes oxybutynin bid as dry mouth worse with tid.     She notes some difficulty with fatigue but feels that phentermine is helping some. Phentermine has not helped weight loss much.  She continues to have insomnia but is better than last visit.  At the last visit, trazodone was added but it caused some drowsiness the next day so she stopped. She snores and has mild daytime sleepiness but never did a PSG that was ordered.   Mood worsened last year after her nephew was murdered and she feels she gets only a little benefit from Prozac even at a higher dose of 40 mg. We discussed changing to different antidepressant.   The lorazepam at night has helped her anxiety at bedtime... She has had some cognitive issues especially reduced short-term memory, spelling a word finding. Phentermine has helped some.    EPWORTH SLEEPINESS SCALE  On a scale of 0 - 3 what is the chance of dozing:  Sitting and Reading:   1 Watching TV:    3 Sitting inactive in a public place: 0 Passenger in car for one hour: 0 Lying down to rest in the afternoon: 3 Sitting and talking to someone: 0 Sitting quietly after lunch:  3 In a car, stopped in traffic:  0  Total (out of 24): 10/24 mild excessive daytime sleepiness   From 08/19/2017: Gait/strength/sensation:  She continues to have some difficulty with her gait. She stumbles a lot but has no recent falls. She has some spasticity in her legs and the gait is a little wide.  She denies much actual weakness or numbness in limbs.   She getd numbness in her hands and feet,     MRI of the brain and cervical spine was performed 07/02/2016. The MRI of the cervical spine shows a subtle focus towards the left at C7-T1. The MRI of the brain showed changes related to MS. There was no change when compared to the prior study 10/13/2012.  She had a relapse in June 2017.    Vision:   She notes mild decreased visual acuity out of the right eye. However,  color vision is symmetric. There is no eye change and no eye infections.  Bladder:    She still has a lot of frequency and urgency.   She has nocturia up to 6 times a night. She does better on oxybutynin and myrbetriq.   She has occasioanl incontinence.  She still wears pads.     She has no hesitancy.     Fatigue/sleep:   Fatigue is more for problem and she notes that she has not been sleeping as well.. Phentermine helped the fatigue some.   She has trouble falling asleep and staying asleep.    Lorazepam has helped her sleep in the past but less since her nephew died.    She snores loudly and has mild daytime sleepiness.   No one has noted snorts or gasps.   She never had the PSG as she moved away  Mood/cognition:    Mood is doing worse with her nephew's murder.    She feels benefit of increased Prozac.   She feels anxiety is well controlled on lorazepam.   She notes mild cognitive dysfunction with reduced short-term memory, spelling and with word finding. Focus and attention are better on Phentermine.    Weight:   She has lost some weight on phentermine and topiramate.    MS History:   She began to experience difficulties with her gait in December 2010. She felt the onset of gait disorder was fairly sudden. She noted bilateral leg clumsiness also noted that her handwriting was worse (right-handed). An MRI of the brain was performed on 02/12/2010 showed multiple white matter foci including one enhancing lesion in the left posterior limb of the internal capsule. Lesions were infratentorial and supratentorial with many in the periventricular white matter. Of note, in 2009 she had noted numbness in one of her legs that lasted for 2 or 3 weeks after she was placed on a muscle relaxant. I saw her on 12/28/2009 and felt the MRI was very consistent with MS. She received a 3 day course of IV steroids and was feeling better by the third day. She chose to start Rebif and tolerated it fairly well when she was  reevaluated 6 weeks later.   Repeat MRI's of the brain 01/2011 and 10/13/2012 showed no new foci.   She hasd an exacerbation with reduced gait and worsening bladder function June 2017.      REVIEW OF SYSTEMS: Constitutional: No fevers, chills, sweats, or change in appetite.  She notes fatigue. She sleeps well at night. Eyes: No visual changes, double vision, eye pain Ear, nose and throat: No hearing  loss, ear pain, nasal congestion, sore throat Cardiovascular: No chest pain, palpitations Respiratory: No shortness of breath at rest or with exertion.   No wheezes.  She snores GastrointestinaI: No nausea, vomiting, diarrhea, abdominal pain, fecal incontinence Genitourinary: No dysuria, urinary retention.   Severe urinary frequency with nocturia.  Recent incontinence has been worse. Musculoskeletal: No neck pain, back pain.   She is having less left knee pain Integumentary: No rash, pruritus, skin lesions Neurological: as above.    Has occipital headaches at times, worse when has busier day.    Psychiatric: Notes depression and anxiety Endocrine: No palpitations, diaphoresis, change in appetite, change in weigh or increased thirst Hematologic/Lymphatic: No anemia, purpura, petechiae. Allergic/Immunologic: No itchy/runny eyes, nasal congestion, recent allergic reactions, rashes  ALLERGIES: No Known Allergies  HOME MEDICATIONS:  Current Outpatient Medications:  .  bisoprolol-hydrochlorothiazide (ZIAC) 10-6.25 MG per tablet, Take 2 tablets by mouth daily., Disp: 60 tablet, Rfl: 5 .  cholecalciferol (VITAMIN D) 1000 UNITS tablet, Take 2,000 Units by mouth daily., Disp: , Rfl:  .  fluticasone (FLONASE) 50 MCG/ACT nasal spray, , Disp: , Rfl:  .  Interferon Beta-1a (REBIF REBIDOSE) 44 MCG/0.5ML SOAJ, Inject 44 mcg into the skin 3 (three) times a week., Disp: 36 Syringe, Rfl: 3 .  levothyroxine (SYNTHROID, LEVOTHROID) 25 MCG tablet, Take 25 mcg by mouth daily before breakfast., Disp: , Rfl:  .   lisinopril (PRINIVIL,ZESTRIL) 10 MG tablet, Take 1 tablet (10 mg total) by mouth daily., Disp: 30 tablet, Rfl: 0 .  LORazepam (ATIVAN) 1 MG tablet, Tale 1.2 to 1 at night, Disp: 90 tablet, Rfl: 1 .  mirabegron ER (MYRBETRIQ) 50 MG TB24 tablet, Take 1 tablet (50 mg total) by mouth daily., Disp: 90 tablet, Rfl: 3 .  Multiple Vitamin (MULTIVITAMIN) tablet, Take 1 tablet by mouth daily., Disp: , Rfl:  .  omeprazole (PRILOSEC) 20 MG capsule, , Disp: , Rfl:  .  oxybutynin (DITROPAN) 5 MG tablet, Take 1 tablet (5 mg total) by mouth 2 (two) times daily., Disp: 180 tablet, Rfl: 4 .  phentermine (ADIPEX-P) 37.5 MG tablet, TAKE 1 TABLET(37.5 MG) BY MOUTH DAILY BEFORE BREAKFAST, Disp: 90 tablet, Rfl: 1 .  traMADol (ULTRAM) 50 MG tablet, Take 1 tablet (50 mg total) by mouth every 6 (six) hours as needed., Disp: 90 tablet, Rfl: 5 .  sertraline (ZOLOFT) 100 MG tablet, Take 1 tablet (100 mg total) by mouth daily., Disp: 30 tablet, Rfl: 11  PAST MEDICAL HISTORY: Past Medical History:  Diagnosis Date  . Depression   . Headache   . Hyperlipidemia   . Hypertension   . MS (multiple sclerosis) (HCC) 2011  . Vision abnormalities     PAST SURGICAL HISTORY: History reviewed. No pertinent surgical history.  FAMILY HISTORY: Family History  Problem Relation Age of Onset  . Hypertension Mother   . Hypertension Father   . Stroke Father   . Diabetes Father   . Heart disease Maternal Grandmother   . Cancer Maternal Grandfather   . Heart disease Paternal Grandmother   . Asthma Paternal Grandfather     SOCIAL HISTORY:  Social History   Socioeconomic History  . Marital status: Single    Spouse name: N/A  . Number of children: 0  . Years of education: 86  . Highest education level: Not on file  Occupational History  . Occupation: Armed forces technical officer at a [employer]  Social Needs  . Financial resource strain: Not on file  . Food insecurity:  Worry: Not on file    Inability: Not on file  .  Transportation needs:    Medical: Not on file    Non-medical: Not on file  Tobacco Use  . Smoking status: Never Smoker  . Smokeless tobacco: Never Used  Substance and Sexual Activity  . Alcohol use: No    Alcohol/week: 0.0 standard drinks  . Drug use: No  . Sexual activity: Yes    Partners: Male    Birth control/protection: Condom  Lifestyle  . Physical activity:    Days per week: Not on file    Minutes per session: Not on file  . Stress: Not on file  Relationships  . Social connections:    Talks on phone: Not on file    Gets together: Not on file    Attends religious service: Not on file    Active member of club or organization: Not on file    Attends meetings of clubs or organizations: Not on file    Relationship status: Not on file  . Intimate partner violence:    Fear of current or ex partner: Not on file    Emotionally abused: Not on file    Physically abused: Not on file    Forced sexual activity: Not on file  Other Topics Concern  . Not on file  Social History Narrative   Lives alone.     PHYSICAL EXAM  Vitals:   10/29/18 0828  BP: (!) 145/81  Pulse: 61  Weight: 230 lb 8 oz (104.6 kg)  Height: 5\' 4"  (1.626 m)    Body mass index is 39.57 kg/m.   General: The patient is well-developed and well-nourished and in no acute distress  Musculoskeletal:  Back is nontender.   She is now non-tender at the left knee.  Neurologic Exam  Mental status: The patient is alert and oriented x 3 at the time of the examination. The patient has apparent normal recent and remote memory, with an apparently normal attention span and concentration ability.   Speech is normal.  Cranial nerves: Extraocular movements are full. SFacial strength and sensation were fine.  The tongue is midline, and the patient has symmetric elevation of the soft palate. No obvious hearing deficits are noted.  Motor:  Muscle bulk is normal.   The muscle tone is increased in her legs mildly.   Strength is 5/5 in the arms and legs..   Sensory: He has intact sensation to touch temperature and vibration in the arms and legs..  Coordination: Finger-nose-finger is performed well but heel-to-shin is reduced bilaterally.  Gait and station: Station is normal.   Gait is mildly wide and mildly spastic. Tandem gait is wide. Romberg is negative.   Reflexes: Deep tendon reflexes are symmetric and normal bilaterally.       DIAGNOSTIC DATA (LABS, IMAGING, TESTING) - I reviewed patient records, labs, notes, testing and imaging myself where available.     ASSESSMENT AND PLAN  OSA (obstructive sleep apnea) - Plan: Ambulatory referral to Dentistry  Multiple sclerosis (HCC) - Plan: MR BRAIN WO CONTRAST, CANCELED: MR BRAIN W WO CONTRAST  Urinary frequency  Other fatigue  Ataxic gait  Excessive daytime sleepiness   1.  Continue weekly for multiple sclerosis.  We need to check a brain MRI to determine if there are subclinical progression that would make [place] choose a different disease modifying therapy..  2.   Continue phentermine for weight loss, attention deficit and fatigue.   3.   She has  mild sleep apnea and excessive daytime sleepiness.  We will try again to get her set up for an oral appliance.  We also discussed the benefits of losing weight.  She will continue phentermine. 4.   Continue oxybutynin and Myrbetriq. 5.   Depression did not do any better on Cymbalta.  She would like to switch to Zoloft.  Will call in the 100 mg pill and have her take 50 mg for a few days during the transition.   She will return to see me in 6 months if stable or call earlier if she has new or worsening neurologic symptoms.    Richard A. Epimenio Foot, MD, PhD 10/29/2018, 9:01 AM Certified in Neurology, Clinical Neurophysiology, Sleep Medicine, Pain Medicine and Neuroimaging  [facility] [address], [address] [place], [place] [address] [phone]

## 2018-10-29 NOTE — Telephone Encounter (Signed)
Initiated PA phentermine on covermymeds. Key: [id number]. In process of completing.

## 2018-10-30 NOTE — Telephone Encounter (Signed)
I called patient. Advised insurance denied PA for phentermine. Instructed her how to get goodrx coupon for 90 days supply. Currently showing cost to be about 17-18 dollars for 90 days supply. She verbalized understanding and appreciation.

## 2018-11-04 NOTE — Telephone Encounter (Signed)
Pt requesting prescription for phentermine be transferred to [facility]

## 2018-11-04 NOTE — Telephone Encounter (Signed)
Tramadol #90 for 30 day supply, not 90 day supply. I will call pharmacy to clarify

## 2018-11-04 NOTE — Telephone Encounter (Signed)
Pt called statin Joanne Parker is needing a PA for the 90 day supply of tramadol

## 2018-11-04 NOTE — Telephone Encounter (Signed)
Tried calling pt back, went to automated message, could not LVM.  Cannot transfer rx, it is a controlled substance. She has to fill at same pharmacy.

## 2018-11-04 NOTE — Telephone Encounter (Signed)
I called pharmacy. Advised rx is for #90 for 30 day supply. He checked again, stating it was a "soft reject". He was able to get paid claim. Nothing further needed.

## 2018-11-05 ENCOUNTER — Other Ambulatory Visit: Payer: Self-pay | Admitting: Neurology

## 2018-11-05 NOTE — Telephone Encounter (Signed)
I called pt. Relayed information below. She verbalized understanding and appreciation.  She also asked about update on tramadol. Advised I spoke with pharmacy last night and resolved that. She will contact pharmacy.

## 2018-11-28 ENCOUNTER — Other Ambulatory Visit: Payer: Medicare PPO

## 2018-12-05 ENCOUNTER — Ambulatory Visit
Admission: RE | Admit: 2018-12-05 | Discharge: 2018-12-05 | Disposition: A | Discharge status: Home / Self Care | Payer: Medicare PPO | Source: Ambulatory Visit | Attending: Neurology | Admitting: Neurology

## 2018-12-05 DIAGNOSIS — G35 Multiple sclerosis: Secondary | ICD-10-CM

## 2018-12-08 ENCOUNTER — Telehealth: Payer: Self-pay | Admitting: *Deleted

## 2018-12-08 NOTE — Telephone Encounter (Signed)
Called, LVM for pt about MRI results per Dr. Sater note. Gave [facility] phone number if she has further questions/concerns.  

## 2018-12-08 NOTE — Telephone Encounter (Signed)
-----   Message from Asa Lente, MD sent at 12/07/2018  7:18 PM EST ----- Please let her know that the MRI of the brain did not show any new MS lesions.

## 2018-12-31 ENCOUNTER — Telehealth: Payer: Self-pay | Admitting: *Deleted

## 2018-12-31 NOTE — Telephone Encounter (Signed)
error 

## 2019-01-27 ENCOUNTER — Telehealth: Payer: Self-pay | Admitting: *Deleted

## 2019-01-27 NOTE — Telephone Encounter (Signed)
I called pt. Advised I received notice from [employer] that they closed her account for [place] d/t non-compliance. Pt states insurance now paying for Rebif. She does not qualify. Advised her to make sure to contact [employer] if she has any changes in insurance and needs financial assistance again. She verbalized understanding and appreciation for call.

## 2019-02-05 ENCOUNTER — Telehealth: Payer: Self-pay | Admitting: *Deleted

## 2019-02-05 NOTE — Telephone Encounter (Addendum)
Submitted PA Rebif on covermymeds. [employer] - PA Case ID: [id number]. #36syringes/90 days supply. Waiting on determination.  Pt has been on Rebif since dx in 2010. She is tolerating well/stable. Dx: H70

## 2019-02-05 NOTE — Telephone Encounter (Signed)
Received fax notification from [facility] that 90-day supply request denied. However, PA WAS approved for 30 days supply for Rebif. Specialty drugs are limited to a 30-day supply.

## 2019-04-24 ENCOUNTER — Other Ambulatory Visit: Payer: Self-pay | Admitting: Neurology

## 2019-04-24 DIAGNOSIS — G35 Multiple sclerosis: Secondary | ICD-10-CM

## 2019-04-29 ENCOUNTER — Telehealth: Payer: Self-pay | Admitting: *Deleted

## 2019-04-29 NOTE — Telephone Encounter (Signed)
Called pt. Updated medication list, pharmacy, allergies on file for VV tomorrow.   She was not sure if she received email sent by Florentina Addison.  I texted her the link at: [phone]. She confirmed receipt.

## 2019-04-29 NOTE — Telephone Encounter (Signed)
Called home number, mother picked up. Asked that she have Chanda call our office back today. She verbalized understanding.  Tried mobile but went straight to VM.

## 2019-04-30 ENCOUNTER — Other Ambulatory Visit: Payer: Self-pay

## 2019-04-30 ENCOUNTER — Encounter: Payer: Self-pay | Admitting: Neurology

## 2019-04-30 ENCOUNTER — Ambulatory Visit (INDEPENDENT_AMBULATORY_CARE_PROVIDER_SITE_OTHER): Payer: Medicare PPO | Admitting: Neurology

## 2019-04-30 DIAGNOSIS — R5383 Other fatigue: Secondary | ICD-10-CM | POA: Diagnosis not present

## 2019-04-30 DIAGNOSIS — R350 Frequency of micturition: Secondary | ICD-10-CM

## 2019-04-30 DIAGNOSIS — G35 Multiple sclerosis: Secondary | ICD-10-CM

## 2019-04-30 DIAGNOSIS — G4733 Obstructive sleep apnea (adult) (pediatric): Secondary | ICD-10-CM

## 2019-04-30 MED ORDER — TAMSULOSIN HCL 0.4 MG PO CAPS: 0.4000 mg | ORAL_CAPSULE | Freq: Every day | ORAL | 5 refills | Status: DC

## 2019-04-30 MED ORDER — PHENTERMINE HCL 37.5 MG PO TABS: ORAL_TABLET | ORAL | 1 refills | Status: DC

## 2019-04-30 MED ORDER — LORAZEPAM 1 MG PO TABS: ORAL_TABLET | ORAL | 1 refills | Status: DC

## 2019-04-30 MED ORDER — TRAMADOL HCL 50 MG PO TABS: 50.0000 mg | ORAL_TABLET | Freq: Four times a day (QID) | ORAL | 5 refills | Status: DC | PRN

## 2019-04-30 MED ORDER — MIRABEGRON ER 50 MG PO TB24: 50.0000 mg | ORAL_TABLET | Freq: Every day | ORAL | 3 refills | Status: DC

## 2019-04-30 MED ORDER — OXYBUTYNIN CHLORIDE 5 MG PO TABS: 5.0000 mg | ORAL_TABLET | Freq: Two times a day (BID) | ORAL | 4 refills | Status: DC

## 2019-04-30 NOTE — Addendum Note (Signed)
Addended by: Despina Arias A on: 04/30/2019 05:31 PM   Modules accepted: Level of Service

## 2019-04-30 NOTE — Progress Notes (Addendum)
[facility]  PATIENT: Joanne Parker DOB: 19-Nov-1961  REFERRING DOCTOR OR PCP:  Garner Nash  SOURCE: Patient  _________________________________   HISTORICAL  CHIEF COMPLAINT:  No chief complaint on file.   HISTORY OF PRESENT ILLNESS:  Joanne Parker is a 59 y.o. woman with MS diagnosed in 2011.     Update 04/30/2019: Virtual Visit via Video Note I connected with Joanne Parker on 04/30/19 at  3:30 PM EDT by a video enabled telemedicine application and verified that I am speaking with the correct person.  I discussed the limitations of evaluation and management by telemedicine and the availability of in person appointments. The patient expressed understanding and agreed to proceed.  Due to technical issues with video, the visit had to be moved to telephone.  History of Present Illness: She is on Rebif for MS and denies new exacerbations.    Physically she feels that she is doing about the same.  Specifically her gait is stable.  Strength is good but she does have some spasticity in her legs and her gait is stiff.  She also has some numbness in the hands and feet at times though the tingling is usually not painful.  Vision is fine.  She is having more urinary issues.   She has urinary urgency still, as before, but also finds that she is not emptying he bladder well.   She is on Oxybutynin and Myrbetriq.   She stopped oxybutynin a while due to dry mouth but went back on.   The reduced emptying did not improve when she was off Myrbetriq.  A few years ago she saw urology and was placed on oxybutynin.  No testing was done.  She reports fatigue that is physical more than cognitive.  She tried phentermine for weight loss and fatigue/sleep.   She has been sleeping worse due to frequent nocturia (6 times last night) she also has sleep apnea worse during REM sleep.  She did not want to do CPAP.  We had hoped that she would lose some weight on phentermine but weight has been  stable.  We discussed having her see a dentist that you make an oral appliance once the COVID-19 pandemic improves.   Observations/Objective: She is alert and fully oriented with fluent speech and attention, knowledge and memory.  Voice is strong.  Hearing appears normal.  Assessment and Plan: Multiple sclerosis (HCC)  Other fatigue  Urinary frequency  OSA (obstructive sleep apnea)  1.   Continue Rebif for MS. 2.   Her bladder issues are worse.  She appears to be having trouble emptying her bladder.  I will have her try tamsulosin to see if that will help.  However, since there is not much hesitancy and may not and she should stop it if there is no benefit after 2 weeks.  If she does not get a benefit I have advised her to ask her primary care about referral to neurology. 3.   Try to lose weight.  Once that is a see elective cases again, we can see if her dentist can make an oral appliance if not we can refer to one who does 4.   Return in 6 months or sooner if there are new or worsening neurologic symptoms.  Follow Up Instructions: I discussed the assessment and treatment plan with the patient. The patient was provided an opportunity to ask questions and all were answered. The patient agreed with the plan and demonstrated an understanding of the  instructions.    The patient was advised to call back or seek an in-person evaluation if the symptoms worsen or if the condition fails to improve as anticipated.  I provided 22 minutes of non-face-to-face time during this encounter.  _________________________ From previous visits Update 10/29/2018 Her MS is stable and she has no exacerbations.    She is tolerating Rebif well.  She denies significant injection reactions.   She had foot pain and a possible infection in her foot.   She is getting set up for an ultrasound to make sure circulation is good.     Her gait is about the same.  Strength and sensation are ok and both legs are symmetric.    She  is on Oxybutynin and Myrbetriq with better bladder control.    PSG April 2019 showed mild overall OSA that was severe during REM. She has mild excessive daytime sleepiness.    Options were CPAP vs. Weight loss and oral appliance and she chose the latter.  A referral was made to a dentist but she never was seen.    Phentermine has helped her fatigue but weight has not been helped much.    She is depressed since her nephew died.    She can't afford   Date 02/17/2018:   She continues on Rebif for her multiple sclerosis. She feels that she has been stable no new neurologic symptoms. She has some spasticity in her legs and a mildly spastic gait. She occasionally will have some numbness in the hands and feet. She denies any change in vision but continues to note mild reduced vision out of the right eye. She has difficulty with her bladder emptying frequency and urgency with nocturia.   Myrbetriq and oxybutynin have helped some.   She uses takes oxybutynin bid as dry mouth worse with tid.     She notes some difficulty with fatigue but feels that phentermine is helping some. Phentermine has not helped weight loss much.  She continues to have insomnia but is better than last visit.  At the last visit, trazodone was added but it caused some drowsiness the next day so she stopped. She snores and has mild daytime sleepiness but never did a PSG that was ordered.   Mood worsened last year after her nephew was murdered and she feels she gets only a little benefit from Prozac even at a higher dose of 40 mg. We discussed changing to different antidepressant.   The lorazepam at night has helped her anxiety at bedtime... She has had some cognitive issues especially reduced short-term memory, spelling a word finding. Phentermine has helped some.    EPWORTH SLEEPINESS SCALE  On a scale of 0 - 3 what is the chance of dozing:  Sitting and Reading:   1 Watching TV:    3 Sitting inactive in a public place: 0 Passenger in  car for one hour: 0 Lying down to rest in the afternoon: 3 Sitting and talking to someone: 0 Sitting quietly after lunch:  3 In a car, stopped in traffic:  0  Total (out of 24): 10/24 mild excessive daytime sleepiness   From 08/19/2017: Gait/strength/sensation:  She continues to have some difficulty with her gait. She stumbles a lot but has no recent falls. She has some spasticity in her legs and the gait is a little wide.  She denies much actual weakness or numbness in limbs.   She getd numbness in her hands and feet,     MRI  of the brain and cervical spine was performed 07/02/2016. The MRI of the cervical spine shows a subtle focus towards the left at C7-T1. The MRI of the brain showed changes related to MS. There was no change when compared to the prior study 10/13/2012.  She had a relapse in June 2017.    Vision:   She notes mild decreased visual acuity out of the right eye. However, color vision is symmetric. There is no eye change and no eye infections.  Bladder:    She still has a lot of frequency and urgency.   She has nocturia up to 6 times a night. She does better on oxybutynin and myrbetriq.   She has occasioanl incontinence.  She still wears pads.     She has no hesitancy.     Fatigue/sleep:   Fatigue is more for problem and she notes that she has not been sleeping as well.. Phentermine helped the fatigue some.   She has trouble falling asleep and staying asleep.    Lorazepam has helped her sleep in the past but less since her nephew died.    She snores loudly and has mild daytime sleepiness.   No one has noted snorts or gasps.   She never had the PSG as she moved away  Mood/cognition:    Mood is doing worse with her nephew's murder.    She feels benefit of increased Prozac.   She feels anxiety is well controlled on lorazepam.   She notes mild cognitive dysfunction with reduced short-term memory, spelling and with word finding. Focus and attention are better on Phentermine.    Weight:    She has lost some weight on phentermine and topiramate.    MS History:   She began to experience difficulties with her gait in December 2010. She felt the onset of gait disorder was fairly sudden. She noted bilateral leg clumsiness also noted that her handwriting was worse (right-handed). An MRI of the brain was performed on 02/12/2010 showed multiple white matter foci including one enhancing lesion in the left posterior limb of the internal capsule. Lesions were infratentorial and supratentorial with many in the periventricular white matter. Of note, in 2009 she had noted numbness in one of her legs that lasted for 2 or 3 weeks after she was placed on a muscle relaxant. I saw her on 12/28/2009 and felt the MRI was very consistent with MS. She received a 3 day course of IV steroids and was feeling better by the third day. She chose to start Rebif and tolerated it fairly well when she was reevaluated 6 weeks later.   Repeat MRI's of the brain 01/2011 and 10/13/2012 showed no new foci.   She hasd an exacerbation with reduced gait and worsening bladder function June 2017.      REVIEW OF SYSTEMS: Constitutional: No fevers, chills, sweats, or change in appetite.  She notes fatigue. She sleeps well at night. Eyes: No visual changes, double vision, eye pain Ear, nose and throat: No hearing loss, ear pain, nasal congestion, sore throat Cardiovascular: No chest pain, palpitations Respiratory: No shortness of breath at rest or with exertion.   No wheezes.  She snores GastrointestinaI: No nausea, vomiting, diarrhea, abdominal pain, fecal incontinence Genitourinary: No dysuria, urinary retention.   Severe urinary frequency with nocturia.  Recent incontinence has been worse. Musculoskeletal: No neck pain, back pain.   She is having less left knee pain Integumentary: No rash, pruritus, skin lesions Neurological: as above.  Has occipital headaches at times, worse when has busier day.    Psychiatric: Notes  depression and anxiety Endocrine: No palpitations, diaphoresis, change in appetite, change in weigh or increased thirst Hematologic/Lymphatic: No anemia, purpura, petechiae. Allergic/Immunologic: No itchy/runny eyes, nasal congestion, recent allergic reactions, rashes  ALLERGIES: No Known Allergies  HOME MEDICATIONS:  Current Outpatient Medications:  .  bisoprolol-hydrochlorothiazide (ZIAC) 10-6.25 MG per tablet, Take 2 tablets by mouth daily., Disp: 60 tablet, Rfl: 5 .  cholecalciferol (VITAMIN D) 1000 UNITS tablet, Take 5,000 Units by mouth daily. , Disp: , Rfl:  .  fluticasone (FLONASE) 50 MCG/ACT nasal spray, , Disp: , Rfl:  .  levothyroxine (SYNTHROID, LEVOTHROID) 25 MCG tablet, Take 25 mcg by mouth daily before breakfast., Disp: , Rfl:  .  lisinopril (PRINIVIL,ZESTRIL) 10 MG tablet, Take 1 tablet (10 mg total) by mouth daily., Disp: 30 tablet, Rfl: 0 .  LORazepam (ATIVAN) 1 MG tablet, Tale 1.2 to 1 at night, Disp: 90 tablet, Rfl: 1 .  mirabegron ER (MYRBETRIQ) 50 MG TB24 tablet, Take 1 tablet (50 mg total) by mouth daily., Disp: 90 tablet, Rfl: 3 .  Multiple Vitamin (MULTIVITAMIN) tablet, Take 1 tablet by mouth daily., Disp: , Rfl:  .  omeprazole (PRILOSEC) 20 MG capsule, Take 20 mg by mouth daily. , Disp: , Rfl:  .  oxybutynin (DITROPAN) 5 MG tablet, Take 1 tablet (5 mg total) by mouth 2 (two) times daily., Disp: 180 tablet, Rfl: 4 .  phentermine (ADIPEX-P) 37.5 MG tablet, TAKE 1 TABLET(37.5 MG) BY MOUTH DAILY BEFORE BREAKFAST, Disp: 90 tablet, Rfl: 1 .  REBIF REBIDOSE 44 MCG/0.5ML SOAJ, Use as directed by physician. Give 44 mcg (0.5 ml) as a subcutaneous injection three times per week.   PROTECT FROM LIGHT, Disp: 36 Syringe, Rfl: 3 .  sertraline (ZOLOFT) 100 MG tablet, Take 1 tablet (100 mg total) by mouth daily., Disp: 30 tablet, Rfl: 11 .  tamsulosin (FLOMAX) 0.4 MG CAPS capsule, Take 1 capsule (0.4 mg total) by mouth daily., Disp: 30 capsule, Rfl: 5 .  traMADol (ULTRAM) 50 MG  tablet, Take 1 tablet (50 mg total) by mouth every 6 (six) hours as needed., Disp: 90 tablet, Rfl: 5  PAST MEDICAL HISTORY: Past Medical History:  Diagnosis Date  . Depression   . Headache   . Hyperlipidemia   . Hypertension   . MS (multiple sclerosis) (HCC) 2011  . Vision abnormalities     PAST SURGICAL HISTORY: History reviewed. No pertinent surgical history.  FAMILY HISTORY: Family History  Problem Relation Age of Onset  . Hypertension Mother   . Hypertension Father   . Stroke Father   . Diabetes Father   . Heart disease Maternal Grandmother   . Cancer Maternal Grandfather   . Heart disease Paternal Grandmother   . Asthma Paternal Grandfather     SOCIAL HISTORY:  Social History   Socioeconomic History  . Marital status: Single    Spouse name: N/A  . Number of children: 0  . Years of education: 4216  . Highest education level: Not on file  Occupational History  . 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 Financial resource strain: Not on file  . Food insecurity:    Worry: Not on file    Inability: Not on file  . Transportation needs:    Medical: Not on file    Non-medical: Not on file  Tobacco Use  . Smoking status: Never Smoker  . Smokeless tobacco: Never  Used  Substance and Sexual Activity  . Alcohol use: No    Alcohol/week: 0.0 standard drinks  . Drug use: No  . Sexual activity: Yes    Partners: Male    Birth control/protection: Condom  Lifestyle  . Physical activity:    Days per week: Not on file    Minutes per session: Not on file  . Stress: Not on file  Relationships  . Social connections:    Talks on phone: Not on file    Gets together: Not on file    Attends religious service: Not on file    Active member of club or organization: Not on file    Attends meetings of clubs or organizations: Not on file    Relationship status: Not on file  . Intimate partner violence:    Fear of current or ex partner: Not on file     Emotionally abused: Not on file    Physically abused: Not on file    Forced sexual activity: Not on file  Other Topics Concern  . Not on file  Social History Narrative   Lives alone.     PHYSICAL EXAM  There were no vitals filed for this visit.  There is no height or weight on file to calculate BMI.   General: The patient is well-developed and well-nourished and in no acute distress  Musculoskeletal:  Back is nontender.   She is now non-tender at the left knee.  Neurologic Exam  Mental status: The patient is alert and oriented x 3 at the time of the examination. The patient has apparent normal recent and remote memory, with an apparently normal attention span and concentration ability.   Speech is normal.  Cranial nerves: Extraocular movements are full. SFacial strength and sensation were fine.  The tongue is midline, and the patient has symmetric elevation of the soft palate. No obvious hearing deficits are noted.  Motor:  Muscle bulk is normal.   The muscle tone is increased in her legs mildly.  Strength is 5/5 in the arms and legs..   Sensory: He has intact sensation to touch temperature and vibration in the arms and legs..  Coordination: Finger-nose-finger is performed well but heel-to-shin is reduced bilaterally.  Gait and station: Station is normal.   Gait is mildly wide and mildly spastic. Tandem gait is wide. Romberg is negative.   Reflexes: Deep tendon reflexes are symmetric and normal bilaterally.       DIAGNOSTIC DATA (LABS, IMAGING, TESTING) - I reviewed patient records, labs, notes, testing and imaging myself where available.     ASSESSMENT AND PLAN  Multiple sclerosis (HCC)  Other fatigue  Urinary frequency  OSA (obstructive sleep apnea)      A. Epimenio Foot, MD, PhD 04/30/2019, 5:31 PM Certified in Neurology, Clinical Neurophysiology, Sleep Medicine, Pain Medicine and Neuroimaging  [facility] [address]  [place], [place] [address] [phone]

## 2019-05-01 ENCOUNTER — Ambulatory Visit: Payer: Medicare PPO | Admitting: Neurology

## 2019-05-04 ENCOUNTER — Other Ambulatory Visit: Payer: Self-pay | Admitting: Neurology

## 2019-05-04 MED ORDER — TRAMADOL HCL 50 MG PO TABS: 50.0000 mg | ORAL_TABLET | Freq: Four times a day (QID) | ORAL | 5 refills | Status: DC | PRN

## 2019-08-08 ENCOUNTER — Other Ambulatory Visit: Payer: Self-pay | Admitting: Neurology

## 2019-08-26 ENCOUNTER — Telehealth: Payer: Self-pay | Admitting: *Deleted

## 2019-08-26 NOTE — Telephone Encounter (Signed)
We received a drug change request from [facility] re: Phentermine 37.5 mg not being covered by the plan. I spoke with the patient. She plans to continue using goodrx. She was told she would have to pay $50 now. I checked good rx, 30 tablets is about $18, 60 tablets is about $33 and 90 tablets is $47. Pt verbalized appreciation for the information. She confirmed she has access to the internet to get a new coupon downloaded.

## 2019-12-23 ENCOUNTER — Telehealth: Payer: Self-pay | Admitting: Neurology

## 2019-12-23 NOTE — Telephone Encounter (Signed)
-----   Message from Hope Pigeon, RN sent at 12/21/2019  4:10 PM EST ----- [name],   Can you please call pt and schedule a follow up? Joanne Parker DOB 19-Apr-1961 She can see Amy as long as she has no new sx. She never got a 6 month scheduled after her virtual visit in 04/2019  Thank you! Terrence Dupont

## 2019-12-23 NOTE — Telephone Encounter (Signed)
I called patient and LVM requesting she call back to set this up.

## 2020-01-07 ENCOUNTER — Telehealth: Payer: Self-pay | Admitting: *Deleted

## 2020-01-07 NOTE — Telephone Encounter (Signed)
Called [facility] at [phone] and spoke with Ebony. She transferred me to pharmacy and I spoke with Katrina who stated PA for Rebif approved 12/25/19-12/23/20. PA: [id number]. They have been unable to reach pt on the phone to refill it. Pt needs to call them at [phone]. I called and spoke with pt mother (on [place]) and provided phone number for them to call pharmacy to get medication refilled. She verbalized understanding and appreciation for call.

## 2020-02-29 ENCOUNTER — Other Ambulatory Visit: Payer: Self-pay

## 2020-02-29 ENCOUNTER — Encounter: Payer: Self-pay | Admitting: Family Medicine

## 2020-02-29 ENCOUNTER — Ambulatory Visit: Payer: Medicare PPO | Admitting: Family Medicine

## 2020-02-29 VITALS — BP 159/85 | HR 50 | Temp 97.0°F | Ht 64.0 in | Wt 225.6 lb

## 2020-02-29 DIAGNOSIS — R350 Frequency of micturition: Secondary | ICD-10-CM | POA: Diagnosis not present

## 2020-02-29 DIAGNOSIS — R5383 Other fatigue: Secondary | ICD-10-CM | POA: Diagnosis not present

## 2020-02-29 DIAGNOSIS — G4733 Obstructive sleep apnea (adult) (pediatric): Secondary | ICD-10-CM

## 2020-02-29 DIAGNOSIS — G4719 Other hypersomnia: Secondary | ICD-10-CM

## 2020-02-29 DIAGNOSIS — G35 Multiple sclerosis: Secondary | ICD-10-CM

## 2020-02-29 DIAGNOSIS — F419 Anxiety disorder, unspecified: Secondary | ICD-10-CM

## 2020-02-29 DIAGNOSIS — Z6839 Body mass index (BMI) 39.0-39.9, adult: Secondary | ICD-10-CM

## 2020-02-29 DIAGNOSIS — M25551 Pain in right hip: Secondary | ICD-10-CM

## 2020-02-29 DIAGNOSIS — F32A Depression, unspecified: Secondary | ICD-10-CM

## 2020-02-29 DIAGNOSIS — M25552 Pain in left hip: Secondary | ICD-10-CM

## 2020-02-29 DIAGNOSIS — F329 Major depressive disorder, single episode, unspecified: Secondary | ICD-10-CM

## 2020-02-29 MED ORDER — MODAFINIL 100 MG PO TABS: 100.0000 mg | ORAL_TABLET | Freq: Every day | ORAL | 5 refills | Status: DC

## 2020-02-29 MED ORDER — LORAZEPAM 1 MG PO TABS: ORAL_TABLET | ORAL | 5 refills | Status: DC

## 2020-02-29 MED ORDER — TRAMADOL HCL 50 MG PO TABS: 50.0000 mg | ORAL_TABLET | Freq: Four times a day (QID) | ORAL | 5 refills | Status: DC | PRN

## 2020-02-29 NOTE — Progress Notes (Signed)
PATIENT: MECHEL HAGGARD DOB: [birth year]  REASON FOR VISIT: follow up HISTORY FROM: patient  Chief Complaint  Patient presents with  . Follow-up    New RM. alone. BL hip pain.      HISTORY OF PRESENT ILLNESS: Today 02/29/20 DELORSE SHANE is a 59 y.o. female here today for follow up multiple sclerosis. She continues Rebif injections.   She continues oxybutynin and Mybetriq for urinary urgency/frequency. Tamsulosin was added in 04/2019 for concerns of incomplete emptying. She reports that some days medications seem to help and sometimes they don't. She was seen by urology over the summer of 2020. She reports that no changes were recommended.   Phentermine had stopped phentermine several months ago. She recently started back. She feels that it helps short term but then wears off.   She feels that depression is worse. She lost her nephew about 3 years ago and feels that this weighs heavily on her. She is taking Zoloft 100mg  daily. She does not feel it helps. She has taken Prozac in the past but felt that it stopped working. She continues ativan 1mg  daily. She has been advised to consider cognitive behavioral therapy but can not afford this.   She has had more trouble with both hips hurting. She describes an achy pain. Worse when she gets up in the mornings. Eases up with movement. She is taking tramadol 50mg  with 500mg  of Tylenol as needed. She usually takes She reports that PCP advised to avoid Nsaids due to elevated creatinine levels.    No changes in gait. No falls. No new numbness or weakness. No vision changes.    HISTORY: (copied from Dr note on 04/30/2019)  She is on Rebif for MS and denies new exacerbations.    Physically she feels that she is doing about the same.  Specifically her gait is stable.  Strength is good but she does have some spasticity in her legs and her gait is stiff.  She also has some numbness in the hands and feet at times though the tingling is usually not  painful.  Vision is fine.  She is having more urinary issues.   She has urinary urgency still, as before, but also finds that she is not emptying he bladder well.   She is on Oxybutynin and Myrbetriq.   She stopped oxybutynin a while due to dry mouth but went back on.   The reduced emptying did not improve when she was off Myrbetriq.  A few years ago she saw urology and was placed on oxybutynin.  No testing was done.  She reports fatigue that is physical more than cognitive.  She tried phentermine for weight loss and fatigue/sleep.   She has been sleeping worse due to frequent nocturia (6 times last night) she also has sleep apnea worse during REM sleep.  She did not want to do CPAP.  We had hoped that she would lose some weight on phentermine but weight has been stable.  We discussed having her see a dentist that you make an oral appliance once the COVID-19 pandemic improves.   REVIEW OF SYSTEMS: Out of a complete 14 system review of symptoms, the patient complains only of the following symptoms, chronic pain, fatigue, depression, anxiety, urinary frequency and urgency and all other reviewed systems are negative.  ALLERGIES: No Known Allergies  HOME MEDICATIONS: Outpatient Medications Prior to Visit  Medication Sig Dispense Refill  . acetaminophen (TYLENOL) 500 MG tablet Take 500 mg by mouth  every 6 (six) hours as needed. OTC    . bisoprolol-hydrochlorothiazide (ZIAC) 10-6.25 MG per tablet Take 2 tablets by mouth daily. 60 tablet 5  . cholecalciferol (VITAMIN D) 1000 UNITS tablet Take 5,000 Units by mouth daily.     . fluticasone (FLONASE) 50 MCG/ACT nasal spray     . levothyroxine (SYNTHROID, LEVOTHROID) 25 MCG tablet Take 25 mcg by mouth daily before breakfast.    . lisinopril (PRINIVIL,ZESTRIL) 10 MG tablet Take 1 tablet (10 mg total) by mouth daily. 30 tablet 0  . mirabegron ER (MYRBETRIQ) 50 MG TB24 tablet Take 1 tablet (50 mg total) by mouth daily. 90 tablet 3  . Multiple Vitamin  (MULTIVITAMIN) tablet Take 1 tablet by mouth daily.    Marland Kitchen omeprazole (PRILOSEC) 20 MG capsule Take 20 mg by mouth daily.     Marland Kitchen oxybutynin (DITROPAN) 5 MG tablet Take 1 tablet (5 mg total) by mouth 2 (two) times daily. 180 tablet 4  . REBIF REBIDOSE 44 MCG/0.5ML SOAJ Use as directed by physician. Give 44 mcg (0.5 ml) as a subcutaneous injection three times per week.   PROTECT FROM LIGHT 36 Syringe 3  . sertraline (ZOLOFT) 100 MG tablet Take 1 tablet (100 mg total) by mouth daily. 30 tablet 11  . LORazepam (ATIVAN) 1 MG tablet Tale 1.2 to 1 at night 90 tablet 1  . phentermine (ADIPEX-P) 37.5 MG tablet TAKE 1 TABLET(37.5 MG) BY MOUTH DAILY BEFORE BREAKFAST 90 tablet 1  . traMADol (ULTRAM) 50 MG tablet Take 1 tablet (50 mg total) by mouth every 6 (six) hours as needed. 90 tablet 5  . tamsulosin (FLOMAX) 0.4 MG CAPS capsule Take 1 capsule (0.4 mg total) by mouth daily. 30 capsule 5   No facility-administered medications prior to visit.    PAST MEDICAL HISTORY: Past Medical History:  Diagnosis Date  . Depression   . Headache   . Hyperlipidemia   . Hypertension   . MS (multiple sclerosis) ([place]) 2011  . Vision abnormalities     PAST SURGICAL HISTORY: No past surgical history on file.  FAMILY HISTORY: Family History  Problem Relation Age of Onset  . Hypertension Mother   . Hypertension Father   . Stroke Father   . Diabetes Father   . Heart disease Maternal Grandmother   . Cancer Maternal Grandfather   . Heart disease Paternal Grandmother   . Asthma Paternal Grandfather     SOCIAL HISTORY: Social History   Socioeconomic History  . Marital status: Single    Spouse name: N/A  . Number of children: 0  . Years of education: 29  . Highest education level: Not on file  Occupational History  . Occupation: Fish farm manager at a [employer]  Tobacco Use  . Smoking status: Never Smoker  . Smokeless tobacco: Never Used  Substance and Sexual Activity  . Alcohol use: No     Alcohol/week: 0.0 standard drinks  . Drug use: No  . Sexual activity: Yes    Partners: Male    Birth control/protection: Condom  Other Topics Concern  . Not on file  Social History Narrative   Lives alone.   Social Determinants of Health   Financial Resource Strain:   . Difficulty of Paying Living Expenses: Not on file  Food Insecurity:   . Worried About Charity fundraiser in the Last Year: Not on file  . Ran Out of Food in the Last Year: Not on file  Transportation Needs:   . Lack of  Transportation (Medical): Not on file  . Lack of Transportation (Non-Medical): Not on file  Physical Activity:   . Days of Exercise per Week: Not on file  . Minutes of Exercise per Session: Not on file  Stress:   . Feeling of Stress : Not on file  Social Connections:   . Frequency of Communication with Friends and Family: Not on file  . Frequency of Social Gatherings with Friends and Family: Not on file  . Attends Religious Services: Not on file  . Active Member of Clubs or Organizations: Not on file  . Attends Banker Meetings: Not on file  . Marital Status: Not on file  Intimate Partner Violence:   . Fear of Current or Ex-Partner: Not on file  . Emotionally Abused: Not on file  . Physically Abused: Not on file  . Sexually Abused: Not on file      PHYSICAL EXAM  Vitals:   02/29/20 1404  BP: (!) 159/85  Pulse: (!) 50  Temp: (!) 97 F (36.1 C)  Weight: 225 lb 9.6 oz (102.3 kg)  Height: 5\' 4"  (1.626 m)   Body mass index is 38.72 kg/m.  Generalized: Well developed, in no acute distress  Cardiology: normal rate and rhythm, no murmur noted Respiratory: Clear to auscultation bilaterally Neurological examination  Mentation: Alert oriented to time, place, history taking. Follows all commands speech and language fluent Cranial nerve II-XII: Pupils were equal round reactive to light. Extraocular movements were full, visual field were full on confrontational test. Facial  sensation and strength were normal. Uvula tongue midline. Head turning and shoulder shrug  were normal and symmetric. Motor: The motor testing reveals 5 over 5 strength of all 4 extremities. Good symmetric motor tone is noted throughout.  Sensory: Sensory testing is intact to soft touch on all 4 extremities. No evidence of extinction is noted.  Coordination: Cerebellar testing reveals good finger-nose-finger and heel-to-shin bilaterally.  Gait and station: Gait is normal.    DIAGNOSTIC DATA (LABS, IMAGING, TESTING) - I reviewed patient records, labs, notes, testing and imaging myself where available.  No flowsheet data found.   No results found for: WBC, HGB, HCT, MCV, PLT No results found for: NA, K, CL, CO2, GLUCOSE, BUN, CREATININE, CALCIUM, PROT, ALBUMIN, AST, ALT, ALKPHOS, BILITOT, GFRNONAA, GFRAA No results found for: CHOL, HDL, LDLCALC, LDLDIRECT, TRIG, CHOLHDL No results found for: No results found for: VITAMINB12 No results found for: TSH     ASSESSMENT AND PLAN 59 y.o. year old female  has a past medical history of Depression, Headache, Hyperlipidemia, Hypertension, MS (multiple sclerosis) (H[facility]) (2011), and Vision abnormalities. here with     ICD-10-CM   1. Relapsing remitting multiple sclerosis (HCC)  G35 modafinil (PROVIGIL) 100 MG tablet    DISCONTINUED: modafinil (PROVIGIL) 100 MG tablet  2. Urinary frequency  R35.0   3. Other fatigue  R53.83 modafinil (PROVIGIL) 100 MG tablet    DISCONTINUED: modafinil (PROVIGIL) 100 MG tablet  4. Excessive daytime sleepiness  G47.19 modafinil (PROVIGIL) 100 MG tablet    DISCONTINUED: modafinil (PROVIGIL) 100 MG tablet  5. Depression, unspecified depression type  F32.9   6. Bilateral hip pain  M25.551 traMADol (ULTRAM) 50 MG tablet   M25.552 DISCONTINUED: traMADol (ULTRAM) 50 MG tablet  7. Anxiety  F41.9 LORazepam (ATIVAN) 1 MG tablet    DISCONTINUED: LORazepam (ATIVAN) 1 MG tablet  8. Obstructive sleep apnea syndrome   G47.33   9. BMI 39.0-39.9,adult  Z68.39 Ambulatory referral to [facility]  Overall, Ms. Barnett is doing fairly well from an MS perspective.  Symptoms are fairly stable on Rebif injections.  Unfortunately, she has had worsening of chronic pain, specifically with both hips, over the past year.  No new numbness or weakness.  She was advised to use tramadol 50 mg up to 3 times daily as needed for pain.  Review of PMP aware shows last refill of tramadol was for 90 tablets in May 2020.  Last refill for lorazepam 90 tablets was in May 2020.  Last refill for phentermine was in September 2020.  I have refilled both tramadol and Ativan.  She will continue Zoloft 100 mg daily.  We have discussed concerns of depression and anxiety.  I have recommended considering referral to psychology.  She is uncertain if she can afford it at this time.  We have also discussed working care to adjust medications if needed.  She will focus on healthy lifestyle changes with well-balanced diet and regular exercise.  We will discontinue phentermine and try modafinil for concerns of fatigue and excessive daytime sleepiness.  She will continue oxybutynin, Myrbetriq and tamsulosin for urinary frequency and urgency.  She was advised to follow-up closely with primary care for management of blood pressure and monitoring of kidney function.  MRI was stable in December 2019.  She will follow-up with [place] in 6 months, sooner if needed.  She verbalizes understanding and agreement with this plan.   Orders Placed This Encounter  Procedures  . Ambulatory referral to Family Practice    Referral Priority:   Routine    Referral Type:   Consultation    Referral Reason:   Specialty Services Required    Referred to Provider:   Wilder Glade, MD    Requested Specialty:   Family Medicine    Number of Visits Requested:   1     Meds ordered this encounter  Medications  . DISCONTD: traMADol (ULTRAM) 50 MG tablet    Sig: Take 1 tablet (50 mg  total) by mouth every 6 (six) hours as needed.    Dispense:  90 tablet    Refill:  5    Order Specific Question:   Supervising Provider    Answer:   Anson Fret [id number]  . DISCONTD: LORazepam (ATIVAN) 1 MG tablet    Sig: Tale 1.2 to 1 at night    Dispense:  30 tablet    Refill:  5    Order Specific Question:   Supervising Provider    Answer:   Anson Fret [id number]  . DISCONTD: modafinil (PROVIGIL) 100 MG tablet    Sig: Take 1 tablet (100 mg total) by mouth daily.    Dispense:  30 tablet    Refill:  5    Order Specific Question:   Supervising Provider    Answer:   Anson Fret [id number]  . traMADol (ULTRAM) 50 MG tablet    Sig: Take 1 tablet (50 mg total) by mouth every 6 (six) hours as needed.    Dispense:  90 tablet    Refill:  5    Order Specific Question:   Supervising Provider    Answer:   Anson Fret [id number]  . LORazepam (ATIVAN) 1 MG tablet    Sig: Tale 1.2 to 1 at night    Dispense:  30 tablet    Refill:  5    Order Specific Question:   Supervising Provider    Answer:   Anson Fret [id number]  .  modafinil (PROVIGIL) 100 MG tablet    Sig: Take 1 tablet (100 mg total) by mouth daily.    Dispense:  30 tablet    Refill:  5    Order Specific Question:   Supervising Provider    Answer:   Anson Fret [id number]      I spent 45 minutes with the patient. 50% of this time was spent counseling and educating patient on plan of care and medications.    Shawnie Dapper, FNP-C 02/29/2020, 3:02 PM [facility] [address] [place], [place] [address] [phone]

## 2020-02-29 NOTE — Progress Notes (Signed)
I have read the note, and I agree with the clinical assessment and plan.  Augustino Savastano A. Taia Bramlett, MD, PhD, FAAN Certified in Neurology, Clinical Neurophysiology, Sleep Medicine, Pain Medicine and Neuroimaging  [facility] [address], [address] [place],  [address] [phone]  

## 2020-02-29 NOTE — Patient Instructions (Addendum)
We will continue Rebif, Zoloft, Ativan, Tramadol, oxybutynin, Mybetriq and tamsulosin as prescribed.   We will try modafinil for fatigue related to MS. Stop phentermine  Try to get daily exercise. Work on [employer]. We will [facility] to [facility].   Follow up in 6 months     Multiple Sclerosis Multiple sclerosis (MS) is a disease of the brain, spinal cord, and optic nerves (central nervous system). It causes the body's disease-fighting (immune) system to destroy the protective covering (myelin sheath) around nerves in the brain. When this happens, signals (nerve impulses) going to and from the brain and spinal cord do not get sent properly or may not get sent at all. There are several types of MS:  Relapsing-remitting MS. This is the most common type. This causes sudden attacks of symptoms. After an attack, you may recover completely until the next attack, or some symptoms may remain permanently.  Secondary progressive MS. This usually develops after the onset of relapsing-remitting MS. Similar to relapsing-remitting MS, this type also causes sudden attacks of symptoms. Attacks may be less frequent, but symptoms slowly get worse (progress) over time.  Primary progressive MS. This causes symptoms that steadily progress over time. This type of MS does not cause sudden attacks of symptoms. The age of onset of MS varies, but it often develops between 67-53 years of age. MS is a lifelong (chronic) condition. There is no cure, but treatment can help slow down the progression of the disease. What are the causes? The cause of this condition is not known. What increases the risk? You are more likely to develop this condition if:  You are a woman.  You have a relative with MS. However, the condition is not passed from parent to child (inherited).  You have a lack (deficiency) of vitamin D.  You smoke. MS is more common in the [place] than in the  [place]. What are the signs or symptoms? Relapsing-remitting and secondary progressive MS cause symptoms to occur in episodes or attacks that may last weeks to months. There may be long periods between attacks in which there are almost no symptoms. Primary progressive MS causes symptoms to steadily progress after they develop. Symptoms of MS vary because of the many different ways it affects the central nervous system. The main symptoms include:  Vision problems and eye pain.  Numbness.  Weakness.  Inability to move your arms, hands, feet, or legs (paralysis).  Balance problems.  Shaking that you cannot control (tremors).  Muscle spasms.  Problems with thinking (cognitive changes). MS can also cause symptoms that are associated with the disease, but are not always the direct result of an MS attack. They may include:  Inability to control urination or bowel movements (incontinence).  Headaches.  Fatigue.  Inability to tolerate heat.  Emotional changes.  Depression.  Pain. How is this diagnosed? This condition is diagnosed based on:  Your symptoms.  A neurological exam. This involves checking central nervous system function, such as nerve function, reflexes, and coordination.  MRIs of the brain and spinal cord.  Lab tests, including a lumbar puncture that tests the fluid that surrounds the brain and spinal cord (cerebrospinal fluid).  Tests to measure the electrical activity of the brain in response to stimulation (evoked potentials). How is this treated? There is no cure for MS, but medicines can help decrease the number and frequency of attacks and help relieve nuisance symptoms. Treatment options may include:  Medicines that reduce the frequency of attacks. These medicines may be given by injection, by mouth (orally), or through an IV.  Medicines that reduce inflammation (steroids). These may provide short-term relief of symptoms.  Medicines to  help control pain, depression, fatigue, or incontinence.  Vitamin D, if you have a deficiency.  Using devices to help you move around (assistive devices), such as braces, a cane, or a walker.  Physical therapy to strengthen and stretch your muscles.  Occupational therapy to help you with everyday tasks.  Alternative or complementary treatments such as exercise, massage, or acupuncture. Follow these instructions at home:  Take over-the-counter and prescription medicines only as told by your health care provider.  Do not drive or use heavy machinery while taking prescription pain medicine.  Use assistive devices as recommended by your physical therapist or your health care provider.  Exercise as directed by your health care provider.  Return to your normal activities as told by your health care provider. Ask your health care provider what activities are safe for you.  Reach out for support. Share your feelings with friends, family, or a support group.  Keep all follow-up visits as told by your health care provider and therapists. This is important. Where to find more information  National Multiple Sclerosis Society: https://www.nationalmssociety.org Contact a health care provider if:  You feel depressed.  You develop new pain or numbness.  You have tremors.  You have problems with sexual function. Get help right away if:  You develop paralysis.  You develop numbness.  You have problems with your bladder or bowel function.  You develop double vision.  You lose vision in one or both eyes.  You develop suicidal thoughts.  You develop severe confusion. If you ever feel like you may hurt yourself or others, or have thoughts about taking your own life, get help right away. You can go to your nearest emergency department or call:  Your local emergency services (911 in the [place]  A suicide crisis helpline, such as the National Suicide Prevention Lifeline at  859-088-5897. This is open 24 hours a day. Summary  Multiple sclerosis (MS) is a disease of the central nervous system that causes the body's immune system to destroy the protective covering (myelin sheath) around nerves in the brain.  There are 3 types of MS: relapsing-remitting, secondary progressive, and primary progressive. Relapsing-remitting and secondary progressive MS cause symptoms to occur in episodes or attacks that may last weeks to months. Primary progressive MS causes symptoms to steadily progress after they develop.  There is no cure for MS, but medicines can help decrease the number and frequency of attacks and help relieve nuisance symptoms. Treatment may also include physical or occupational therapy.  If you develop numbness, paralysis, vision problems, or other neurological symptoms, get help right away. This information is not intended to replace advice given to you by your health care provider. Make sure you discuss any questions you have with your health care provider. Document Revised: 11/22/2017 Document Reviewed: 02/18/2017 Elsevier Patient Education  2020 [employer].

## 2020-04-07 ENCOUNTER — Telehealth: Payer: Self-pay | Admitting: *Deleted

## 2020-04-07 NOTE — Telephone Encounter (Addendum)
Modafinil Approved today PA Case: [id number],  Status: Approved, Coverage Starts on: 12/25/2019 12:00:00 AM, Coverage Ends on: 12/23/2020 12:00:00 AM. Questions? Contact [phone]. Approval faxed to [facility], [place], [phone].

## 2020-04-07 NOTE — Telephone Encounter (Signed)
Modafinil PA, key:  [id number], MS related fatigue, failed phentermine. Information sent to [facility].

## 2020-04-18 ENCOUNTER — Other Ambulatory Visit: Payer: Self-pay | Admitting: Neurology

## 2020-04-18 DIAGNOSIS — G35 Multiple sclerosis: Secondary | ICD-10-CM

## 2020-05-08 ENCOUNTER — Other Ambulatory Visit: Payer: Self-pay

## 2020-05-08 ENCOUNTER — Emergency Department (HOSPITAL_COMMUNITY): Payer: Medicare PPO

## 2020-05-08 ENCOUNTER — Encounter (HOSPITAL_COMMUNITY): Payer: Self-pay

## 2020-05-08 ENCOUNTER — Emergency Department (HOSPITAL_COMMUNITY)
Admission: EM | Admit: 2020-05-08 | Discharge: 2020-05-08 | Disposition: A | Discharge status: Home / Self Care | Payer: Medicare PPO | Attending: Emergency Medicine | Admitting: Emergency Medicine

## 2020-05-08 DIAGNOSIS — Z79899 Other long term (current) drug therapy: Secondary | ICD-10-CM | POA: Diagnosis not present

## 2020-05-08 DIAGNOSIS — Y9389 Activity, other specified: Secondary | ICD-10-CM | POA: Diagnosis not present

## 2020-05-08 DIAGNOSIS — S0993XA Unspecified injury of face, initial encounter: Secondary | ICD-10-CM

## 2020-05-08 DIAGNOSIS — W19XXXA Unspecified fall, initial encounter: Secondary | ICD-10-CM | POA: Insufficient documentation

## 2020-05-08 DIAGNOSIS — Z23 Encounter for immunization: Secondary | ICD-10-CM | POA: Insufficient documentation

## 2020-05-08 DIAGNOSIS — I10 Essential (primary) hypertension: Secondary | ICD-10-CM | POA: Diagnosis not present

## 2020-05-08 DIAGNOSIS — Y998 Other external cause status: Secondary | ICD-10-CM | POA: Diagnosis not present

## 2020-05-08 DIAGNOSIS — S0181XA Laceration without foreign body of other part of head, initial encounter: Secondary | ICD-10-CM | POA: Insufficient documentation

## 2020-05-08 DIAGNOSIS — Y929 Unspecified place or not applicable: Secondary | ICD-10-CM | POA: Diagnosis not present

## 2020-05-08 MED ORDER — TETANUS-DIPHTHERIA TOXOIDS TD 5-2 LFU IM INJ
0.5000 mL | INJECTION | Freq: Once | INTRAMUSCULAR | Status: AC
  Administered 2020-05-08: 0.5 mL via INTRAMUSCULAR
  Filled 2020-05-08: qty 0.5

## 2020-05-08 NOTE — ED Provider Notes (Signed)
[facility]-EMERGENCY DEPT Provider Note   CSN: [id number] Arrival date & time: 05/08/20  8921     History Chief Complaint  Patient presents with  . Fall  . Facial Injury    Joanne Parker is a 59 y.o. female.  59 year old female presents with injury to left side of face.  States that she had mechanical fall yesterday with unknown loss of consciousness.  Has been facial laceration to her left forehead.  This injury occurred about 4:30 PM yesterday.  Denies any headache, neck pain.  No chest or abdominal discomfort.  No weakness in upper or lower extremities.  Bleeding controlled with direct pressure.  Has a history of MS and does receive injectables for this.  States her balance has been slightly more off recently.  Denies any infectious symptoms        Past Medical History:  Diagnosis Date  . Depression   . Headache   . Hyperlipidemia   . Hypertension   . MS (multiple sclerosis) (HCC) 2011  . Vision abnormalities     Patient Active Problem List   Diagnosis Date Noted  . BMI 39.0-39.9,adult 10/29/2018  . OSA (obstructive sleep apnea) 03/25/2018  . Knee pain, left 05/17/2016  . Other headache syndrome 12/08/2015  . Snoring 08/11/2015  . Excessive daytime sleepiness 08/11/2015  . Optic neuritis 02/10/2015  . Urinary frequency 02/10/2015  . Other fatigue 02/10/2015  . Disturbed cognition 02/10/2015  . Ataxic gait 02/10/2015  . Multiple sclerosis (HCC) 01/01/2013  . HTN (hypertension) 01/01/2013  . Hyperlipidemia 01/01/2013  . Depression 01/01/2013    History reviewed. No pertinent surgical history.   OB History   No obstetric history on file.     Family History  Problem Relation Age of Onset  . Hypertension Mother   . Hypertension Father   . Stroke Father   . Diabetes Father   . Heart disease Maternal Grandmother   . Cancer Maternal Grandfather   . Heart disease Paternal Grandmother   . Asthma Paternal Grandfather     Social  History   Tobacco Use  . Smoking status: Never Smoker  . Smokeless tobacco: Never Used  Substance Use Topics  . Alcohol use: Yes    Alcohol/week: 0.0 standard drinks    Comment: occasional  . Drug use: No    Home Medications Prior to Admission medications   Medication Sig Start Date End Date Taking? Authorizing Provider  acetaminophen (TYLENOL) 500 MG tablet Take 500 mg by mouth every 6 (six) hours as needed. OTC    [provider]  bisoprolol-hydrochlorothiazide (ZIAC) 10-6.25 MG per tablet Take 2 tablets by mouth daily. 11/22/12   Carmelina Dane, MD  cholecalciferol (VITAMIN D) 1000 UNITS tablet Take 5,000 Units by mouth daily.  11/04/15   [provider]  fluticasone Aleda Grana) 50 MCG/ACT nasal spray  05/28/16   [provider]  levothyroxine (SYNTHROID, LEVOTHROID) 25 MCG tablet Take 25 mcg by mouth daily before breakfast.    [provider]  lisinopril (PRINIVIL,ZESTRIL) 10 MG tablet Take 1 tablet (10 mg total) by mouth daily. 11/24/12   Carmelina Dane, MD  LORazepam (ATIVAN) 1 MG tablet Tale 1.2 to 1 at night 02/29/20   Lomax, Amy, NP  mirabegron ER (MYRBETRIQ) 50 MG TB24 tablet Take 1 tablet (50 mg total) by mouth daily. 04/30/19   Sater, Pearletha Furl, MD  modafinil (PROVIGIL) 100 MG tablet Take 1 tablet (100 mg total) by mouth daily. 02/29/20   Lomax,  Amy, NP  Multiple Vitamin (MULTIVITAMIN) tablet Take 1 tablet by mouth daily.    [provider]  omeprazole (PRILOSEC) 20 MG capsule Take 20 mg by mouth daily.  05/28/16   [provider]  oxybutynin (DITROPAN) 5 MG tablet Take 1 tablet (5 mg total) by mouth 2 (two) times daily. 04/30/19   Sater, Nanine Means, MD  REBIF REBIDOSE 44 MCG/0.5ML SOAJ Use as directed by physician. Give 44 mcg (0.5 ml) as a subcutaneous injection three times per week.   PROTECT FROM LIGHT 04/18/20   Lomax, Amy, NP  sertraline (ZOLOFT) 100 MG tablet Take 1 tablet (100 mg total) by mouth daily. 10/29/18   Sater,  Nanine Means, MD  traMADol (ULTRAM) 50 MG tablet Take 1 tablet (50 mg total) by mouth every 6 (six) hours as needed. 02/29/20   Debbora Presto, NP    Allergies    Patient has no known allergies.  Review of Systems   Review of Systems  All other systems reviewed and are negative.   Physical Exam Updated Vital Signs BP (!) 178/86 (BP Location: Left Arm)   Pulse 77   Temp 98.2 F (36.8 C) (Oral)   Resp 18   Ht 1.626 m (5\' 4" )   Wt 10.9 kg   LMP 12/28/2014   SpO2 100%   BMI 4.12 kg/m   Physical Exam Vitals and nursing note reviewed.  Constitutional:      General: She is not in acute distress.    Appearance: Normal appearance. She is well-developed. She is not toxic-appearing.  HENT:     Head: Normocephalic. Abrasion and laceration present.   Eyes:     General: Lids are normal.     Conjunctiva/sclera: Conjunctivae normal.     Pupils: Pupils are equal, round, and reactive to light.  Neck:     Thyroid: No thyroid mass.     Trachea: No tracheal deviation.  Cardiovascular:     Rate and Rhythm: Normal rate and regular rhythm.     Heart sounds: Normal heart sounds. No murmur. No gallop.   Pulmonary:     Effort: Pulmonary effort is normal. No respiratory distress.     Breath sounds: Normal breath sounds. No stridor. No decreased breath sounds, wheezing, rhonchi or rales.  Abdominal:     General: Bowel sounds are normal. There is no distension.     Palpations: Abdomen is soft.     Tenderness: There is no abdominal tenderness. There is no rebound.  Musculoskeletal:        General: No tenderness. Normal range of motion.     Cervical back: Normal range of motion and neck supple.  Skin:    General: Skin is warm and dry.     Findings: No abrasion or rash.  Neurological:     Mental Status: She is alert and oriented to person, place, and time.     GCS: GCS eye subscore is 4. GCS verbal subscore is 5. GCS motor subscore is 6.     Cranial Nerves: Cranial nerves are intact. No cranial  nerve deficit.     Sensory: No sensory deficit.     Motor: Motor function is intact. No weakness or tremor.     Coordination: Coordination normal.     Gait: Gait normal.  Psychiatric:        Speech: Speech normal.        Behavior: Behavior normal.     ED Results / Procedures / Treatments   Labs (all labs  ordered are listed, but only abnormal results are displayed) Labs Reviewed - No data to display  EKG None  Radiology No results found.  Procedures Procedures (including critical care time)  Medications Ordered in ED Medications  tetanus & diphtheria toxoids (adult) (TENIVAC) injection 0.5 mL (has no administration in time range)    ED Course  I have reviewed the triage vital signs and the nursing notes.  Pertinent labs & imaging results that were available during my care of the patient were reviewed by me and considered in my medical decision making (see chart for details).    MDM Rules/Calculators/A&P                      Patient's tetanus status updated here.  Wound reapproximated with Steri-Strips per nursing.  Patient to be discharged Final Clinical Impression(s) / ED Diagnoses Final diagnoses:  None    Rx / DC Orders ED Discharge Orders    None       Lorre Nick, MD 05/08/20 1335

## 2020-05-08 NOTE — ED Triage Notes (Signed)
Patient states she was shopping yesterday and fell forward onto her face. Patient states, she did not pass out. Patient denies taking blood thinners.  Patient has a laceration above the left eyebrow and bruising to the left cheek area. Patient has a superficial abrasion to the left shin area.

## 2020-05-09 ENCOUNTER — Telehealth: Payer: Self-pay | Admitting: Family Medicine

## 2020-05-09 NOTE — Telephone Encounter (Signed)
Any shot Joanne Parker or Joanne Parker have availability? I don't think Dr Epimenio Foot does either. I could try to work her in one day this week but I would not be able to tell her how long the wait would be. CT looked ok. We could update MRI. Last one was stable in 2019.

## 2020-05-09 NOTE — Telephone Encounter (Signed)
Pt has called to ask that Alder.RN calls her at if she is unavailable on the 1st #.  Please call  Home#[phone] if pt is not reached on the cell

## 2020-05-09 NOTE — Telephone Encounter (Signed)
LMVM for pt that returned call.  °

## 2020-05-09 NOTE — Telephone Encounter (Signed)
Pt called stating that she fell and had to go to the ER. She states that she is leaving out of town and was informed by the ER that she is needing to be seen before she leaves. There were no soon appts available. Please advise.

## 2020-05-09 NOTE — Telephone Encounter (Signed)
I called pt and made appt with MM/NP at 1400 tomorrow.

## 2020-05-09 NOTE — Telephone Encounter (Signed)
I called pt.  She has been in town for  [facility].  She has noted gait off, some not feeling right, some memory feels like relapse.  She did have a fall (cause??) when in store and hit head, had imaging and fine. Wanted to get in for evaluation prior to leaving to go back home.  Please advise.  (this would be for MS, not the head injury).  I dont have anything for you soon.

## 2020-05-10 ENCOUNTER — Encounter: Payer: Self-pay | Admitting: Adult Health

## 2020-05-10 ENCOUNTER — Ambulatory Visit: Payer: Medicare PPO | Admitting: Adult Health

## 2020-05-10 ENCOUNTER — Other Ambulatory Visit: Payer: Self-pay

## 2020-05-10 VITALS — BP 160/80 | Ht 64.0 in | Wt 229.0 lb

## 2020-05-10 DIAGNOSIS — G35 Multiple sclerosis: Secondary | ICD-10-CM | POA: Diagnosis not present

## 2020-05-10 NOTE — Patient Instructions (Addendum)
Your Plan:  Continue Rebif, ativan, oxybutynin, Provigil, zoloft, ultram  Repeat imaging Blood work today If your symptoms worsen or you develop new symptoms please let us know.    Thank you for coming to see [place] at [facility]. I hope we have been able to provide you high quality care today.  You may receive a patient satisfaction survey over the next few weeks. We would appreciate your feedback and comments so that we may continue to improve ourselves and the health of our patients.

## 2020-05-10 NOTE — Progress Notes (Signed)
I have read the note, and I agree with the clinical assessment and plan.  Zayden Hahne A. Eavan Gonterman, MD, PhD, FAAN Certified in Neurology, Clinical Neurophysiology, Sleep Medicine, Pain Medicine and Neuroimaging  [facility] [address], [address] [place], [place] [address] [phone]  

## 2020-05-10 NOTE — Progress Notes (Signed)
PATIENT: Joanne Parker DOB: October 15, 1961  REASON FOR VISIT: follow up HISTORY FROM: patient  HISTORY OF PRESENT ILLNESS: Today 05/10/20:  Joanne Parker is a 59 year old female with a history of multiple sclerosis.  She returns today for follow-up.  She reports that she has done well on Rebif.  Denies any new numbness or tingling.  Reports that overall she feels that her gait has remained relatively stable however she notes that her mom mentioned that she was walking funny.  She states that she may have some pain in the feet when she walks.  Describes this as an ache.  She recently had a fall while shopping at [facility] she does not know what caused the fall.  She did suffer a laceration over the left eye.  She went to the ED the next day and had a CT scan of the head.  This was unremarkable.  She continues on oxybutynin, Myrbetriq and tamsulosin for her bladder.  Denies any bowel issues.  Reports that she continues to struggle with depression after the death of her nephew 3 years ago.  She has never sought out any counseling due to the price.  She remains on Zoloft.  She has noticed some changes with her memory.  Primarily when she is driving.  Reports that she went to the wrong office today.  States that she also went shopping at [employer] and went in without turning her car off.  At home she reports that she is able to complete all ADLs independently.  She manages her own medications and appointments without difficulty.  She returns today for an evaluation.  HISTORY 02/29/20 Joanne Parker is a 59 y.o. female here today for follow up multiple sclerosis. She continues Rebif injections.   She continues oxybutynin and Mybetriq for urinary urgency/frequency. Tamsulosin was added in 04/2019 for concerns of incomplete emptying. She reports that some days medications seem to help and sometimes they don't. She was seen by urology over the summer of 2020. She reports that no changes were recommended.    Phentermine had stopped phentermine several months ago. She recently started back. She feels that it helps short term but then wears off.   She feels that depression is worse. She lost her nephew about 3 years ago and feels that this weighs heavily on her. She is taking Zoloft 100mg  daily. She does not feel it helps. She has taken Prozac in the past but felt that it stopped working. She continues ativan 1mg  daily. She has been advised to consider cognitive behavioral therapy but can not afford this.   She has had more trouble with both hips hurting. She describes an achy pain. Worse when she gets up in the mornings. Eases up with movement. She is taking tramadol 50mg  with 500mg  of Tylenol as needed. She usually takes She reports that PCP advised to avoid Nsaids due to elevated creatinine levels.    No changes in gait. No falls. No new numbness or weakness. No vision changes.    HISTORY: (copied from Dr note on 04/30/2019)  She is on Rebif for MS and denies new exacerbations.Physically she feels that she is doing about the same. Specifically her gait is stable. Strength is good but she does have some spasticity in her legs and her gait is stiff. She also has some numbness in the hands and feet at times though the tingling is usually not painful. Vision is fine.  She is having more urinary issues. She  has urinary urgency still, as before, but also finds that she is not emptying he bladder well. She is on Oxybutynin and Myrbetriq. She stopped oxybutynin a while due to dry mouth but went back on. The reduced emptying did not improve when she was off Myrbetriq. A few years ago she saw urology and was placed on oxybutynin. No testing was done.  She reports fatigue that is physical more than cognitive. She tried phentermine for weight loss and fatigue/sleep.She has been sleeping worse due to frequent nocturia (6 times last night) she also has sleep apnea worse during  REM sleep. She did not want to do CPAP. We had hoped that she would lose some weight on phentermine but weight has been stable. We discussed having her see a dentist that you make an oral appliance once the COVID-19 pandemic improves.   REVIEW OF SYSTEMS: Out of a complete 14 system review of symptoms, the patient complains only of the following symptoms, and all other reviewed systems are negative.  See HPI  ALLERGIES: No Known Allergies  HOME MEDICATIONS: Outpatient Medications Prior to Visit  Medication Sig Dispense Refill  . acetaminophen (TYLENOL) 500 MG tablet Take 500 mg by mouth every 6 (six) hours as needed. OTC    . bisoprolol-hydrochlorothiazide (ZIAC) 10-6.25 MG per tablet Take 2 tablets by mouth daily. 60 tablet 5  . cholecalciferol (VITAMIN D) 1000 UNITS tablet Take 5,000 Units by mouth daily.     . fluticasone (FLONASE) 50 MCG/ACT nasal spray     . levothyroxine (SYNTHROID, LEVOTHROID) 25 MCG tablet Take 25 mcg by mouth daily before breakfast.    . lisinopril (PRINIVIL,ZESTRIL) 10 MG tablet Take 1 tablet (10 mg total) by mouth daily. 30 tablet 0  . LORazepam (ATIVAN) 1 MG tablet Tale 1.2 to 1 at night 30 tablet 5  . mirabegron ER (MYRBETRIQ) 50 MG TB24 tablet Take 1 tablet (50 mg total) by mouth daily. 90 tablet 3  . modafinil (PROVIGIL) 100 MG tablet Take 1 tablet (100 mg total) by mouth daily. 30 tablet 5  . Multiple Vitamin (MULTIVITAMIN) tablet Take 1 tablet by mouth daily.    Marland Kitchen omeprazole (PRILOSEC) 20 MG capsule Take 20 mg by mouth daily.     Marland Kitchen oxybutynin (DITROPAN) 5 MG tablet Take 1 tablet (5 mg total) by mouth 2 (two) times daily. 180 tablet 4  . REBIF REBIDOSE 44 MCG/0.5ML SOAJ Use as directed by physician. Give 44 mcg (0.5 ml) as a subcutaneous injection three times per week.   PROTECT FROM LIGHT 18 mL 4  . sertraline (ZOLOFT) 100 MG tablet Take 1 tablet (100 mg total) by mouth daily. 30 tablet 11  . traMADol (ULTRAM) 50 MG tablet Take 1 tablet (50 mg total)  by mouth every 6 (six) hours as needed. 90 tablet 5   No facility-administered medications prior to visit.    PAST MEDICAL HISTORY: Past Medical History:  Diagnosis Date  . Depression   . Headache   . Hyperlipidemia   . Hypertension   . MS (multiple sclerosis) (HCC) 2011  . Vision abnormalities     PAST SURGICAL HISTORY: No past surgical history on file.  FAMILY HISTORY: Family History  Problem Relation Age of Onset  . Hypertension Mother   . Hypertension Father   . Stroke Father   . Diabetes Father   . Heart disease Maternal Grandmother   . Cancer Maternal Grandfather   . Heart disease Paternal Grandmother   . Asthma Paternal Grandfather  SOCIAL HISTORY: Social History   Socioeconomic History  . Marital status: Single    Spouse name: N/A  . Number of children: 0  . Years of education: 67  . Highest education level: Not on file  Occupational History  . 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 Smoking status: Never Smoker  . Smokeless tobacco: Never Used  Substance and Sexual Activity  . Alcohol use: Yes    Alcohol/week: 0.0 standard drinks    Comment: occasional  . Drug use: No  . Sexual activity: Yes    Partners: Male    Birth control/protection: Condom  Other Topics Concern  . Not on file  Social History Narrative   Lives alone.   Social Determinants of Health   Financial Resource Strain:   . Difficulty of Paying Living Expenses:   Food Insecurity:   . Worried About Programme researcher, broadcasting/film/video in the Last Year:   . Barista in the Last Year:   Transportation Needs:   . Freight forwarder (Medical):   Marland Kitchen Lack of Transportation (Non-Medical):   Physical Activity:   . Days of Exercise per Week:   . Minutes of Exercise per Session:   Stress:   . Feeling of Stress :   Social Connections:   . Frequency of Communication with Friends and Family:   . Frequency of Social Gatherings with Friends and Family:   . Attends  Religious Services:   . Active Member of Clubs or Organizations:   . Attends Banker Meetings:   Marland Kitchen Marital Status:   Intimate Partner Violence:   . Fear of Current or Ex-Partner:   . Emotionally Abused:   Marland Kitchen Physically Abused:   . Sexually Abused:       PHYSICAL EXAM  Vitals:   05/10/20 1354  BP: (!) 160/80  Weight: 229 lb (103.9 kg)  Height: 5\' 4"  (1.626 m)   Body mass index is 39.31 kg/m.  Generalized: Well developed, in no acute distress   Neurological examination  Mentation: Alert oriented to time, place, history taking. Follows all commands speech and language fluent Cranial nerve II-XII: Pupils were equal round reactive to light. Extraocular movements were full, visual field were full on confrontational test.  Head turning and shoulder shrug  were normal and symmetric. Motor: The motor testing reveals 5 over 5 strength of all 4 extremities. Good symmetric motor tone is noted throughout.  Sensory: Sensory testing is intact to soft touch on all 4 extremities. No evidence of extinction is noted.  Coordination: Cerebellar testing reveals good finger-nose-finger and heel-to-shin bilaterally.  Gait and station: Gait is slightly wide-based.  Unable to complete tandem gait.  Romberg is negative Reflexes: Deep tendon reflexes are symmetric and normal bilaterally.   DIAGNOSTIC DATA (LABS, IMAGING, TESTING) - I reviewed patient records, labs, notes, testing and imaging myself where available.  No results found for: WBC, HGB, HCT, MCV, PLT No results found for: NA, K, CL, CO2, GLUCOSE, BUN, CREATININE, CALCIUM, PROT, ALBUMIN, AST, ALT, ALKPHOS, BILITOT, GFRNONAA, GFRAA No results found for: CHOL, HDL, LDLCALC, LDLDIRECT, TRIG, CHOLHDL No results found for: No results found for: VITAMINB12 No results found for: TSH    ASSESSMENT AND PLAN 59 y.o. year old female  has a past medical history of Depression, Headache, Hyperlipidemia, Hypertension, MS (multiple  sclerosis) (HCC) (2011), and Vision abnormalities. here with:    Multiple sclerosis   Continue Rebif  Repeat MRI of the brain  and cervical spine to look for progression of MS  Blood work today  Urinary Frequency   Continue Myrbetriq and oxybutynin  Flomax prescribed by PCP  Depression   Continue Zoloft  Memory disturbance   MMSE 25 out of 30--may be contributed to depression?  Will monitor for now   I spent 25 minutes of face-to-face and non-face-to-face time with patient.  This included previsit chart review, lab review, study review, order entry, electronic health record documentation, patient education.  Ward Givens, MSN, NP-C 05/10/2020, 2:07 PM [facility] [address] [place], [place] [address] [phone]

## 2020-05-11 ENCOUNTER — Encounter: Payer: Self-pay | Admitting: *Deleted

## 2020-05-11 LAB — COMPREHENSIVE METABOLIC PANEL
ALT: 13 IU/L (ref 0–32)
AST: 20 IU/L (ref 0–40)
Albumin/Globulin Ratio: 1.2 (ref 1.2–2.2)
Albumin: 3.8 g/dL (ref 3.8–4.9)
Alkaline Phosphatase: 110 IU/L (ref 48–121)
BUN/Creatinine Ratio: 19 (ref 9–23)
BUN: 34 mg/dL — ABNORMAL HIGH (ref 6–24)
Bilirubin Total: 0.4 mg/dL (ref 0.0–1.2)
CO2: 24 mmol/L (ref 20–29)
Calcium: 9.5 mg/dL (ref 8.7–10.2)
Chloride: 106 mmol/L (ref 96–106)
Creatinine, Ser: 1.78 mg/dL — ABNORMAL HIGH (ref 0.57–1.00)
GFR calc Af Amer: 36 mL/min/{1.73_m2} — ABNORMAL LOW (ref >59)
GFR calc non Af Amer: 31 mL/min/{1.73_m2} — ABNORMAL LOW (ref >59)
Globulin, Total: 3.3 g/dL (ref 1.5–4.5)
Glucose: 62 mg/dL — ABNORMAL LOW (ref 65–99)
Potassium: 3.8 mmol/L (ref 3.5–5.2)
Sodium: 146 mmol/L — ABNORMAL HIGH (ref 134–144)
Total Protein: 7.1 g/dL (ref 6.0–8.5)

## 2020-05-11 LAB — CBC WITH DIFFERENTIAL/PLATELET
Basophils Absolute: 0 10*3/uL (ref 0.0–0.2)
Basos: 0 %
EOS (ABSOLUTE): 0.1 10*3/uL (ref 0.0–0.4)
Eos: 1 %
Hematocrit: 39 % (ref 34.0–46.6)
Hemoglobin: 12.4 g/dL (ref 11.1–15.9)
Immature Grans (Abs): 0 10*3/uL (ref 0.0–0.1)
Immature Granulocytes: 0 %
Lymphocytes Absolute: 2.2 10*3/uL (ref 0.7–3.1)
Lymphs: 23 %
MCH: 25.2 pg — ABNORMAL LOW (ref 26.6–33.0)
MCHC: 31.8 g/dL (ref 31.5–35.7)
MCV: 79 fL (ref 79–97)
Monocytes Absolute: 0.7 10*3/uL (ref 0.1–0.9)
Monocytes: 7 %
Neutrophils Absolute: 6.5 10*3/uL (ref 1.4–7.0)
Neutrophils: 69 %
Platelets: 315 10*3/uL (ref 150–450)
RBC: 4.93 x10E6/uL (ref 3.77–5.28)
RDW: 13.8 % (ref 11.7–15.4)
WBC: 9.6 10*3/uL (ref 3.4–10.8)

## 2020-05-11 LAB — HEMOGLOBIN A1C
Est. average glucose Bld gHb Est-mCnc: 120 mg/dL
Hgb A1c MFr Bld: 5.8 % — ABNORMAL HIGH (ref 4.8–5.6)

## 2020-05-11 NOTE — Telephone Encounter (Signed)
Relayed to pt that note faxed and she is ok to go back to work.  Fax confirmation received [phone].

## 2020-05-11 NOTE — Telephone Encounter (Signed)
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  Mainly sits with them may help with bathing.  She was not aware she needed this when in the office.

## 2020-05-11 NOTE — Telephone Encounter (Signed)
Ok to provide note

## 2020-05-11 NOTE — Telephone Encounter (Addendum)
Pt called stating she is needing a letter for work stating it is okay for her to return sent to her employer [employer]  Fax:[phone]

## 2020-05-12 ENCOUNTER — Telehealth: Payer: Self-pay | Admitting: *Deleted

## 2020-05-12 NOTE — Telephone Encounter (Signed)
LVM requesting call back for lab results. Results faxed to PCP per NP.

## 2020-05-12 NOTE — Telephone Encounter (Signed)
Called PCP, LVM requesting call back to leave office fax # with our phone staff.

## 2020-05-16 NOTE — Telephone Encounter (Signed)
Pt called to inform PCP Fax#[phone]

## 2020-05-16 NOTE — Telephone Encounter (Signed)
Received call back from patient and informed her labs showed creatinine level is elevated. GFR is decreased. her Hemoglobin A1c is 5.8 which indicates prediabetes. She stated her PCP follows her kidney function. She state she will call PCP to get fax # as I was unable to get answer at PCP office last week. Patient verbalized understanding, appreciation.

## 2020-05-16 NOTE — Telephone Encounter (Signed)
Lab results successfully faxed to her PCP.

## 2020-05-18 ENCOUNTER — Telehealth: Payer: Self-pay | Admitting: Adult Health

## 2020-05-18 NOTE — Telephone Encounter (Signed)
Joanne Parker: [id number] (exp. 05/18/20 to 06/17/20) & [id number] (exp. 05/18/20 to 06/17/20) order sent to GI. They will reach out to the patient to schedule.

## 2020-05-27 NOTE — Telephone Encounter (Signed)
Pt is asking for a call from Irving Burton to discuss locations closer to where she lives that she might be able to go to for the MRI instead of GI.  Please call

## 2020-05-30 NOTE — Telephone Encounter (Signed)
scheduled at [facility] for mon. 06/06/20 arrive at 1:30 pm pt is aware of time & day. I also left their number of [phone] incase she needed to r/s.

## 2020-06-15 ENCOUNTER — Telehealth: Payer: Self-pay | Admitting: *Deleted

## 2020-06-15 ENCOUNTER — Telehealth: Payer: Self-pay | Admitting: Adult Health

## 2020-06-15 NOTE — Telephone Encounter (Signed)
Request faxed to [phone] [facility]

## 2020-06-15 NOTE — Telephone Encounter (Signed)
Request made

## 2020-06-15 NOTE — Telephone Encounter (Signed)
I received MRI results from [facility].  MRI of the brain without contrast: White matter disease highly suggestive of demyelinating processes such as multiple sclerosis.  No mass affect or edema.  No evidence of acute infarct  MRI of the cervical spine without contrast:  Multilevel degenerative disc disease.  Disc protrusions at C3-4 and C5-6 with abutment and compression of the anterior aspect of the cervical cord at these levels  MRI brain was not compared to previous scans. I will request CD for our providers to compare.

## 2020-06-20 ENCOUNTER — Telehealth: Payer: Self-pay | Admitting: *Deleted

## 2020-06-20 NOTE — Telephone Encounter (Signed)
R/c cd from [place] cd on [employer].

## 2020-06-23 NOTE — Telephone Encounter (Signed)
Dr. Pearlean Brownie compared images. MRI stable compared to scan in 2019. Please let patient know.

## 2020-06-29 ENCOUNTER — Telehealth: Payer: Self-pay | Admitting: *Deleted

## 2020-06-29 NOTE — Telephone Encounter (Signed)
Faxed completed/signed form for oral surgery clearance back to Drs Claudia Pollock and [place], [place] at [phone]. Per Dr. Epimenio Foot: "Pt neurologically cleared for dental surgery". Received fax confirmation.

## 2020-06-29 NOTE — Telephone Encounter (Signed)
Joanne Parker is a 59 y.o. female was contacted and notified of the message below. Pt has no additional questions or concerns.

## 2020-08-03 ENCOUNTER — Other Ambulatory Visit: Payer: Self-pay | Admitting: Adult Health

## 2020-08-03 DIAGNOSIS — M25552 Pain in left hip: Secondary | ICD-10-CM

## 2020-08-03 DIAGNOSIS — M25551 Pain in right hip: Secondary | ICD-10-CM

## 2020-08-03 MED ORDER — TRAMADOL HCL 50 MG PO TABS: 50.0000 mg | ORAL_TABLET | Freq: Four times a day (QID) | ORAL | 0 refills | Status: DC | PRN

## 2020-08-03 NOTE — Telephone Encounter (Addendum)
336I called [facility] rang and rang, at lunch??  Drug registry checked and last fill 04-05-20 #28 (from 02-29-20 script AL/NP).  Pt taking prn, last 2 days has taken every 6 hours for hip pain. [phone] [facility] [place], [address].  I finally got intouch with [facility].  Since pt takes prn, she will need to get 7 days worth per FDA instructions then there after get another prescription.  (if needs more).

## 2020-08-03 NOTE — Telephone Encounter (Signed)
Pt is needing a refill request sent in to her [facility] pharmacy in [facility]

## 2020-08-17 ENCOUNTER — Telehealth: Payer: Self-pay | Admitting: *Deleted

## 2020-08-17 ENCOUNTER — Other Ambulatory Visit: Payer: Self-pay | Admitting: Neurology

## 2020-08-17 NOTE — Telephone Encounter (Signed)
Received a refill request from the pharmacy for sertraline 100mg , one tablet daily. Per Epic, it was last sent in on 10/29/2018 with 11 refills. I called her to see if another provider has been prescribing this for her. States she had stopped the medication for a while but feel she needs it again for her depression. Per vo by Dr. 13/05/2018, ok to send in the prescription at this dosage. The patient is aware she can pick up from the pharmacy.

## 2020-08-31 ENCOUNTER — Ambulatory Visit: Payer: Medicare PPO | Admitting: Family Medicine

## 2020-08-31 ENCOUNTER — Ambulatory Visit: Payer: Medicare PPO | Admitting: Neurology

## 2020-09-21 ENCOUNTER — Telehealth: Payer: Self-pay | Admitting: Adult Health

## 2020-09-21 NOTE — Telephone Encounter (Signed)
Spoke to Joanne Parker, Joanne Parker states she believes she is relapsing, she  Reports balance issues,crying and states "she doesn't feel right feels like walking in air ". She was unable to give other specific symptoms.   She called her PCP hoping they would advise her to go to hospital for infusion but she was told to call our office.  Joanne Parker is concerned because she is 2 hours away from our office.  Please advise

## 2020-09-21 NOTE — Telephone Encounter (Signed)
Pt called stating that she is having an MS flare up and is needing to speak to the RN to be advised on what she can do.

## 2020-09-21 NOTE — Telephone Encounter (Signed)
Spoke to Emerson NP  Instructed to schedule pt an appt tomorrow morning  Pt declined to come in, states she is not able to drive 2 hr to our office and rather have her PCP set her up at the hospital   Per MM/NP advise pt to be evaluated at ED   Pt agreed with plan

## 2020-10-24 ENCOUNTER — Telehealth: Payer: Self-pay | Admitting: Adult Health

## 2020-10-24 NOTE — Telephone Encounter (Signed)
Neil Crouch from Oral Surgeon's office Dr. Charolett Bumpers, called to see if pt. Needs to continue REBIF REBIDOSE 44 MCG/0.5ML SOAJ or stop it for surgery tomorrow. Please advise.

## 2020-10-24 NOTE — Telephone Encounter (Addendum)
I called Neil Crouch, pt has impacted wisdom teeth.  I relayed that it is ok for her to continue taking her rebif as normal, no need to hold per Butch Penny, NP.   She will relay to Dr. Jenetta Loges.

## 2020-11-21 ENCOUNTER — Encounter: Payer: Self-pay | Admitting: Neurology

## 2020-11-21 ENCOUNTER — Ambulatory Visit (INDEPENDENT_AMBULATORY_CARE_PROVIDER_SITE_OTHER): Payer: Medicare PPO | Admitting: Neurology

## 2020-11-21 VITALS — BP 185/86 | HR 57 | Ht 64.0 in | Wt 238.0 lb

## 2020-11-21 DIAGNOSIS — G35 Multiple sclerosis: Secondary | ICD-10-CM

## 2020-11-21 DIAGNOSIS — R5383 Other fatigue: Secondary | ICD-10-CM | POA: Diagnosis not present

## 2020-11-21 DIAGNOSIS — G4733 Obstructive sleep apnea (adult) (pediatric): Secondary | ICD-10-CM | POA: Diagnosis not present

## 2020-11-21 DIAGNOSIS — R350 Frequency of micturition: Secondary | ICD-10-CM

## 2020-11-21 DIAGNOSIS — Z6841 Body Mass Index (BMI) 40.0 and over, adult: Secondary | ICD-10-CM

## 2020-11-21 MED ORDER — PHENTERMINE HCL 37.5 MG PO CAPS: 37.5000 mg | ORAL_CAPSULE | ORAL | 1 refills | Status: DC

## 2020-11-21 NOTE — Progress Notes (Signed)
[facility]  PATIENT: Joanne Parker DOB: 02/03/1961  REFERRING DOCTOR OR PCP:  Garner Nash  SOURCE: Patient  _________________________________   HISTORICAL  CHIEF COMPLAINT:  Chief Complaint  Patient presents with  . Follow-up    RM 13 with mother. Last seen 05/10/20 by MM,NP  . Multiple Sclerosis    On Rebif. Having pain in right leg that started about three weeks ago. Pain intermittent. Worse at night.     HISTORY OF PRESENT ILLNESS:  Joanne Parker is a 59 y.o. woman with MS diagnosed in 2011.     Update 11/21/2020: She is on Rebif for MS and denies new exacerbations.    She tolerates it well.  In April, she had a bad fall while shopping - she does not recall what caused the fall.   She hit her head and had blood gushing out.   She went to the ED the next day and had a CT.  She later had MRI brian and cervical spine [facility]) 06/08/2020.    MRI brain showed "periventricular white matter disease and scattered white matter hyperintensities extending to the subcortical regions bilaterally. These are oriented perpendicular to the ventricles.".     MRI cervical spine showed a normal spinal cord and multilevel DJD/DDD with protrusions at C3C4 and C5C6 with compression of the thecal sac.    Over the last few days she has noted pain in her right leg.   Gait is about the same..  Strength is ok and she notes some muscle spasticity, especially in her legs.  She also has some numbness in the hands and feet at times though the tingling is usually not painful.  Vision is about the same.     She is having less urianry urgency andno recent incontinence. .   She is on Oxybutynin and Myrbetriq.    She reports a lot of fatigue that is physical more than cognitive.  She tried phentermine for weight loss and fatigue/sleep and had some benefit.   She more recently was switched to Provigil but felt phentermine helped better.   She also had weight loss on phentermine of a  few pounds.  She has mild OSA and was hoping to avoid CPAP and wei discussed weight loss and considering an oral appliance.    She has had her Covid-19 vaccination and plans on the booster in the next couple weeks.     MS History:   She began to experience difficulties with her gait in December 2010. She felt the onset of gait disorder was fairly sudden. She noted bilateral leg clumsiness also noted that her handwriting was worse (right-handed). An MRI of the brain was performed on 02/12/2010 showed multiple white matter foci including one enhancing lesion in the left posterior limb of the internal capsule. Lesions were infratentorial and supratentorial with many in the periventricular white matter. Of note, in 2009 she had noted numbness in one of her legs that lasted for 2 or 3 weeks after she was placed on a muscle relaxant. I saw her on 12/28/2009 and felt the MRI was very consistent with MS. She received a 3 day course of IV steroids and was feeling better by the third day. She chose to start Rebif and tolerated it fairly well when she was reevaluated 6 weeks later.   Repeat MRI's of the brain 01/2011 and 10/13/2012 showed no new foci.   She hasd an exacerbation with reduced gait and worsening bladder function June 2017.  Imaging: MRI brain  [facility]) 06/08/2020.    MRI brain showed periventricular white matter disease and scattered white matter hyperintensities extending to the subcortical regions bilaterally. These are oriented perpendicular to the ventricles.      MRI cervical spine 06/08/2020 showed a normal spinal cord and multilevel DJD/DDD with protrusions at [facility] and C5C6 with compression of the thecal sac.    MRI brain 12/05/2018 showed Multiple T2/flair hyperintense foci in the hemispheres and pons in a pattern and configuration consistent with chronic demyelinating plaque associated with multiple sclerosis.  None of the foci appears to be acute.  When compared to the MRI  dated 07/02/2016, no definite interval change.   REVIEW OF SYSTEMS: Constitutional: No fevers, chills, sweats, or change in appetite.  She notes fatigue. She sleeps well at night. Eyes: No visual changes, double vision, eye pain Ear, nose and throat: No hearing loss, ear pain, nasal congestion, sore throat Cardiovascular: No chest pain, palpitations Respiratory: No shortness of breath at rest or with exertion.   No wheezes.  She snores GastrointestinaI: No nausea, vomiting, diarrhea, abdominal pain, fecal incontinence Genitourinary: No dysuria, urinary retention.   Severe urinary frequency with nocturia.  Recent incontinence has been worse. Musculoskeletal: No neck pain, back pain.   She is having less left knee pain Integumentary: No rash, pruritus, skin lesions Neurological: as above.    Has occipital headaches at times, worse when has busier day.    Psychiatric: Notes depression and anxiety Endocrine: No palpitations, diaphoresis, change in appetite, change in weigh or increased thirst Hematologic/Lymphatic: No anemia, purpura, petechiae. Allergic/Immunologic: No itchy/runny eyes, nasal congestion, recent allergic reactions, rashes  ALLERGIES: No Known Allergies  HOME MEDICATIONS:  Current Outpatient Medications:  .  acetaminophen (TYLENOL) 500 MG tablet, Take 500 mg by mouth every 6 (six) hours as needed. OTC, Disp: , Rfl:  .  bisoprolol-hydrochlorothiazide (ZIAC) 10-6.25 MG per tablet, Take 2 tablets by mouth daily., Disp: 60 tablet, Rfl: 5 .  cholecalciferol (VITAMIN D) 1000 UNITS tablet, Take 5,000 Units by mouth daily. , Disp: , Rfl:  .  fluticasone (FLONASE) 50 MCG/ACT nasal spray, , Disp: , Rfl:  .  levothyroxine (SYNTHROID, LEVOTHROID) 25 MCG tablet, Take 25 mcg by mouth daily before breakfast., Disp: , Rfl:  .  lisinopril (PRINIVIL,ZESTRIL) 10 MG tablet, Take 1 tablet (10 mg total) by mouth daily., Disp: 30 tablet, Rfl: 0 .  LORazepam (ATIVAN) 1 MG tablet, Tale 1.2 to 1  at night, Disp: 30 tablet, Rfl: 5 .  mirabegron ER (MYRBETRIQ) 50 MG TB24 tablet, Take 1 tablet (50 mg total) by mouth daily., Disp: 90 tablet, Rfl: 3 .  Multiple Vitamin (MULTIVITAMIN) tablet, Take 1 tablet by mouth daily., Disp: , Rfl:  .  omeprazole (PRILOSEC) 20 MG capsule, Take 20 mg by mouth daily. , Disp: , Rfl:  .  oxybutynin (DITROPAN) 5 MG tablet, Take 1 tablet (5 mg total) by mouth 2 (two) times daily., Disp: 180 tablet, Rfl: 4 .  REBIF REBIDOSE 44 MCG/0.5ML SOAJ, Use as directed by physician. Give 44 mcg (0.5 ml) as a subcutaneous injection three times per week.   PROTECT FROM LIGHT, Disp: 18 mL, Rfl: 4 .  sertraline (ZOLOFT) 100 MG tablet, TAKE 1 TABLET(100 MG) BY MOUTH DAILY, Disp: 30 tablet, Rfl: 11 .  tamsulosin (FLOMAX) 0.4 MG CAPS capsule, Take 0.4 mg by mouth daily., Disp: , Rfl:  .  traMADol (ULTRAM) 50 MG tablet, Take 1 tablet (50 mg total) by mouth every 6 (  six) hours as needed., Disp: 90 tablet, Rfl: 0 .  phentermine 37.5 MG capsule, Take 1 capsule (37.5 mg total) by mouth every morning., Disp: 90 capsule, Rfl: 1  PAST MEDICAL HISTORY: Past Medical History:  Diagnosis Date  . Depression   . Headache   . Hyperlipidemia   . Hypertension   . MS (multiple sclerosis) (HCC) 2011  . Vision abnormalities     PAST SURGICAL HISTORY: History reviewed. No pertinent surgical history.  FAMILY HISTORY: Family History  Problem Relation Age of Onset  . Hypertension Mother   . Hypertension Father   . Stroke Father   . Diabetes Father   . Heart disease Maternal Grandmother   . Cancer Maternal Grandfather   . Heart disease Paternal Grandmother   . Asthma Paternal Grandfather     SOCIAL HISTORY:  Social History   Socioeconomic History  . Marital status: Single    Spouse name: N/A  . Number of children: 0  . Years of education: 69  . Highest education level: Not on file  Occupational History  . 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  Smoking status: Never Smoker  . Smokeless tobacco: Never Used  Vaping Use  . Vaping Use: Never used  Substance and Sexual Activity  . Alcohol use: Yes    Alcohol/week: 0.0 standard drinks    Comment: occasional  . Drug use: No  . Sexual activity: Yes    Partners: Male    Birth control/protection: Condom  Other Topics Concern  . Not on file  Social History Narrative   Lives alone.   Social Determinants of Health   Financial Resource Strain:   . Difficulty of Paying Living Expenses: Not on file  Food Insecurity:   . Worried About Programme researcher, broadcasting/film/video in the Last Year: Not on file  . Ran Out of Food in the Last Year: Not on file  Transportation Needs:   . Lack of Transportation (Medical): Not on file  . Lack of Transportation (Non-Medical): Not on file  Physical Activity:   . Days of Exercise per Week: Not on file  . Minutes of Exercise per Session: Not on file  Stress:   . Feeling of Stress : Not on file  Social Connections:   . Frequency of Communication with Friends and Family: Not on file  . Frequency of Social Gatherings with Friends and Family: Not on file  . Attends Religious Services: Not on file  . Active Member of Clubs or Organizations: Not on file  . Attends Banker Meetings: Not on file  . Marital Status: Not on file  Intimate Partner Violence:   . Fear of Current or Ex-Partner: Not on file  . Emotionally Abused: Not on file  . Physically Abused: Not on file  . Sexually Abused: Not on file     PHYSICAL EXAM  Vitals:   11/21/20 0945  BP: (!) 185/86  Pulse: (!) 57  Weight: 238 lb (108 kg)  Height: 5\' 4"  (1.626 m)    Body mass index is 40.85 kg/m.   General: The patient is well-developed and well-nourished and in no acute distress  Musculoskeletal:  Back is nontender.   She is now non-tender at the left knee.  Neurologic Exam  Mental status: The patient is alert and oriented x 3 at the time of the examination. The patient has  apparent normal recent and remote memory, with an apparently normal attention span and concentration ability.  Speech is normal.  Cranial nerves: Extraocular movements are full. Facial strength and sensation were fine.  The tongue is midline, and the patient has symmetric elevation of the soft palate. No obvious hearing deficits are noted.  Motor:  Muscle bulk is normal.   The muscle tone is increased in her legs mildly.  Strength is 5/5 in the arms and legs..   Sensory: He has intact sensation to touch temperature and vibration in the arms and legs..  Coordination: Finger-nose-finger is performed well and heel-to-shin is mildly reduced bilaterally.  Gait and station: Station is normal.   Gait is mildly wide and mildly spastic.  Tandem gait is wide. Romberg is negative.   Reflexes: Deep tendon reflexes are symmetric and normal bilaterally.        ASSESSMENT AND PLAN  Relapsing remitting multiple sclerosis (HCC)  Other fatigue  Urinary frequency  OSA (obstructive sleep apnea)  BMI 40.0-44.9, adult (HCC)  1.  Continue Rebif. 2.  Restart phentermine 37.5 mg daily for MS related fatigue and weight gain. 3.  We discussed that weight loss will also help her mild OSA.  If fatigue and sleepiness worsens consider getting an oral appliance 4.   Return in 6 months or sooner for new or worsening neurologic symptoms.   Axxel Gude A. Epimenio Foot, MD, PhD 11/21/2020, 3:34 PM Certified in Neurology, Clinical Neurophysiology, Sleep Medicine, Pain Medicine and Neuroimaging  [facility] [address], [address] [place], [place] [address] [phone]

## 2020-11-22 ENCOUNTER — Telehealth: Payer: Self-pay | Admitting: *Deleted

## 2020-11-22 NOTE — Telephone Encounter (Signed)
Request faxed to [facility] [phone] request pt mri images on Cd.

## 2021-01-02 ENCOUNTER — Telehealth: Payer: Self-pay | Admitting: Neurology

## 2021-01-02 NOTE — Telephone Encounter (Signed)
Pt. states on Sat. she was driving & started seeing lines & had to pull over. She states she had to call mom to pick her up. Please advise.

## 2021-01-02 NOTE — Telephone Encounter (Signed)
Called patient who stated she was wearing prescription driving glasses. She changed into prescription sunglasses thinking the sun was too bright. The issue didn't correct. So she called family to get her. That night she noted her eyes were red too. The issues were resolved the next day and she has been fine since. She saw eye dr in Dec and no change made to glasses Rx. She wanted to know if she needs to see eye dr or see Dr Epimenio Foot for this. I advised will let Dr Epimenio Foot know and call her back. Patient verbalized understanding, appreciation.

## 2021-01-02 NOTE — Telephone Encounter (Signed)
Please let her know:   If the symptoms did not last more than a day, it is unlikely to be MS. Most likely, it was a migraine aura. Sometimes this can occur without actually having a migraine headache afterwards. If he was back to normal I would not do anything different. However, if symptoms are persisting or if this happens again let us know

## 2021-01-03 NOTE — Telephone Encounter (Signed)
Spoke with patient and informed her of Dr Ledon Snare' advice, recommendations. Patient verbalized understanding, appreciation.

## 2021-01-24 ENCOUNTER — Telehealth: Payer: Self-pay | Admitting: Neurology

## 2021-01-24 MED ORDER — OXYBUTYNIN CHLORIDE 5 MG PO TABS: 5.0000 mg | ORAL_TABLET | Freq: Two times a day (BID) | ORAL | 3 refills | Status: DC

## 2021-01-24 NOTE — Addendum Note (Signed)
Addended by: Lilla Shook on: 01/24/2021 05:40 PM   Modules accepted: Orders

## 2021-01-24 NOTE — Telephone Encounter (Signed)
I spoke to the patient. She is no longer using [facility] mail order and would like her prescription sent to [facility]. She still needs a 90-day supply. I confirmed the location and the new rx has been sent.

## 2021-01-24 NOTE — Telephone Encounter (Signed)
Pt. is requesting for oxybutynin (DITROPAN) 5 MG tablet to be sent to [facility].

## 2021-03-02 ENCOUNTER — Telehealth: Payer: Self-pay | Admitting: *Deleted

## 2021-03-02 ENCOUNTER — Other Ambulatory Visit: Payer: Self-pay | Admitting: Neurology

## 2021-03-02 DIAGNOSIS — F419 Anxiety disorder, unspecified: Secondary | ICD-10-CM

## 2021-03-02 DIAGNOSIS — M25552 Pain in left hip: Secondary | ICD-10-CM

## 2021-03-02 DIAGNOSIS — M25551 Pain in right hip: Secondary | ICD-10-CM

## 2021-03-02 MED ORDER — LORAZEPAM 1 MG PO TABS: ORAL_TABLET | ORAL | 5 refills | Status: DC

## 2021-03-02 MED ORDER — TRAMADOL HCL 50 MG PO TABS: 50.0000 mg | ORAL_TABLET | Freq: Four times a day (QID) | ORAL | 0 refills | Status: DC | PRN

## 2021-03-02 NOTE — Telephone Encounter (Signed)
Pt request refills traMADol (ULTRAM) 50 MG tablet and LORazepam (ATIVAN) 1 MG tablet at [facility].  New insurance info: BC[employer] Member ID: [id number] Group ID: [id number] BIN: [id number] PCN: [facility]

## 2021-03-02 NOTE — Telephone Encounter (Signed)
Submitted PA Ativan on CMM. Key: [id number]. Waiting on determination from [employer].

## 2021-03-06 NOTE — Telephone Encounter (Signed)
PA denied. Submitted appeal letter to fax: [phone]. Received fax confirmation. Waiting on determination.

## 2021-03-13 NOTE — Telephone Encounter (Addendum)
Called [phone]. Spoke with [employer]. They do not see appeal sent on 03/06/21. She placed me on hold to speak with colleague to see if they can pull it up in another location. They confirmed they did not receive fax. I confirmed we sent to correct fax#. I resent via fax and she placed me on hold to try and confirm they received it. They have not had it come through yet. Asked we call back tomorrow to check to ensure they received it.

## 2021-03-14 NOTE — Telephone Encounter (Signed)
Called insurance to check on status of appeal. Spoke with Clydie Braun. Confirmed they received our fax yesterday and should have determination by 03/16/21.

## 2021-03-14 NOTE — Telephone Encounter (Signed)
Received fax confirmation from [employer] that appeal is approved 12/24/2020-03/14/2022. Case Number: [id number].  Faxed notice of approval to [facility] at [phone]. Received fax confirmation.

## 2021-03-15 ENCOUNTER — Telehealth: Payer: Self-pay | Admitting: Neurology

## 2021-03-15 NOTE — Telephone Encounter (Signed)
Noted, we are aware of approval

## 2021-03-15 NOTE — Telephone Encounter (Signed)
Joanne Parker @ [facility] has called to inform of the approval for LORazepam (ATIVAN) 1 MG .  Pt may contact pharmacy for refill

## 2021-04-10 ENCOUNTER — Other Ambulatory Visit: Payer: Self-pay | Admitting: Neurology

## 2021-04-13 ENCOUNTER — Other Ambulatory Visit: Payer: Self-pay | Admitting: *Deleted

## 2021-04-13 DIAGNOSIS — G35 Multiple sclerosis: Secondary | ICD-10-CM

## 2021-04-13 MED ORDER — REBIF REBIDOSE 44 MCG/0.5ML ~~LOC~~ SOAJ: SUBCUTANEOUS | 4 refills | Status: DC

## 2021-05-25 ENCOUNTER — Ambulatory Visit: Payer: Medicare PPO | Admitting: Neurology

## 2021-06-07 ENCOUNTER — Other Ambulatory Visit: Payer: Self-pay | Admitting: Neurology

## 2021-08-21 ENCOUNTER — Ambulatory Visit: Payer: Medicare PPO | Admitting: Neurology

## 2021-09-28 ENCOUNTER — Ambulatory Visit (INDEPENDENT_AMBULATORY_CARE_PROVIDER_SITE_OTHER): Payer: 59 | Admitting: Neurology

## 2021-09-28 ENCOUNTER — Telehealth: Payer: Self-pay | Admitting: Neurology

## 2021-09-28 ENCOUNTER — Encounter: Payer: Self-pay | Admitting: Neurology

## 2021-09-28 DIAGNOSIS — G459 Transient cerebral ischemic attack, unspecified: Secondary | ICD-10-CM | POA: Diagnosis not present

## 2021-09-28 DIAGNOSIS — G35 Multiple sclerosis: Secondary | ICD-10-CM

## 2021-09-28 DIAGNOSIS — H539 Unspecified visual disturbance: Secondary | ICD-10-CM | POA: Diagnosis not present

## 2021-09-28 DIAGNOSIS — R269 Unspecified abnormalities of gait and mobility: Secondary | ICD-10-CM | POA: Diagnosis not present

## 2021-09-28 NOTE — Telephone Encounter (Signed)
Pt called needing to speak to the RN or Provider due to her having the feeling that she is about to have a relapse. Please advise.

## 2021-09-28 NOTE — Progress Notes (Signed)
[facility]  PATIENT: Joanne Parker DOB: [birth year]  REFERRING DOCTOR OR PCP:  Garner Nash  SOURCE: Patient  _________________________________   HISTORICAL  CHIEF COMPLAINT:  Chief Complaint  Patient presents with   Multiple Sclerosis    Virtual Visit via Telephone Note  I connected with Joanne Parker on 09/28/21 at  3:00 PM EDT by telephone and verified that I am speaking with the correct person using two identifiers.  Location: Patient: Home Provider: Office   I discussed the limitations, risks, security and privacy concerns of performing an evaluation and management service by telephone and the availability of in person appointments. I also discussed with the patient that there may be a patient responsible charge related to this service. The patient expressed understanding and agreed to proceed.   History of Present Illness: Joanne Parker is a 60 y.o. woman with MS diagnosed in 2011.     Update 09/28/2021 She is on Rebif for MS   She tolerates it well.  She had reduced vision x 1 minute yesterday while driving.  This was bilateral and s felt completely blind for a few seconds.  She had never had an event like this but did have a spell while driving last year where she kept blinking and squinting like the sun was in it but it was not.     She has noted more issues with gait the past 2 weeks.  She is stumbling more due to reduce balance.    She has had more pain and is taking tramadol / Tylenol   MS History:   She began to experience difficulties with her gait in December 2010. She felt the onset of gait disorder was fairly sudden. She noted bilateral leg clumsiness also noted that her handwriting was worse (right-handed). An MRI of the brain was performed on 02/12/2010 showed multiple white matter foci including one enhancing lesion in the left posterior limb of the internal capsule. Lesions were infratentorial and supratentorial with many in the  periventricular white matter. Of note, in 2009 she had noted numbness in one of her legs that lasted for 2 or 3 weeks after she was placed on a muscle relaxant. I saw her on 12/28/2009 and felt the MRI was very consistent with MS. She received a 3 day course of IV steroids and was feeling better by the third day. She chose to start Rebif and tolerated it fairly well when she was reevaluated 6 weeks later.   Repeat MRI's of the brain 01/2011 and 10/13/2012 showed no new foci.   She hasd an exacerbation with reduced gait and worsening bladder function June 2017.     Imaging: MRI brain  [facility]; [place]) 06/08/2020.    MRI brain showed periventricular white matter disease and scattered white matter hyperintensities extending to the subcortical regions bilaterally. These are oriented perpendicular to the ventricles.      MRI cervical spine 06/08/2020 showed a normal spinal cord and multilevel DJD/DDD with protrusions at [facility] and C5C6 with compression of the thecal sac.    MRI brain 12/05/2018 showed Multiple T2/flair hyperintense foci in the hemispheres and pons in a pattern and configuration consistent with chronic demyelinating plaque associated with multiple sclerosis.  None of the foci appears to be acute.  When compared to the MRI dated 07/02/2016, no definite interval change.   REVIEW OF SYSTEMS: Constitutional: No fevers, chills, sweats, or change in appetite.  She notes fatigue. She sleeps well at night. Eyes: No visual  changes, double vision, eye pain Ear, nose and throat: No hearing loss, ear pain, nasal congestion, sore throat Cardiovascular: No chest pain, palpitations Respiratory:  No shortness of breath at rest or with exertion.   No wheezes.  She snores GastrointestinaI: No nausea, vomiting, diarrhea, abdominal pain, fecal incontinence Genitourinary:  No dysuria, urinary retention.   Severe urinary frequency with nocturia.  Recent incontinence has been worse. Musculoskeletal:  No neck  pain, back pain.   She is having less left knee pain Integumentary: No rash, pruritus, skin lesions Neurological: as above.    Has occipital headaches at times, worse when has busier day.    Psychiatric: Notes depression and anxiety Endocrine: No palpitations, diaphoresis, change in appetite, change in weigh or increased thirst Hematologic/Lymphatic:  No anemia, purpura, petechiae. Allergic/Immunologic: No itchy/runny eyes, nasal congestion, recent allergic reactions, rashes  ALLERGIES: No Known Allergies  HOME MEDICATIONS:  Current Outpatient Medications:    acetaminophen (TYLENOL) 500 MG tablet, Take 500 mg by mouth every 6 (six) hours as needed. OTC, Disp: , Rfl:    bisoprolol-hydrochlorothiazide (ZIAC) 10-6.25 MG per tablet, Take 2 tablets by mouth daily., Disp: 60 tablet, Rfl: 5   cholecalciferol (VITAMIN D) 1000 UNITS tablet, Take 5,000 Units by mouth daily. , Disp: , Rfl:    fluticasone (FLONASE) 50 MCG/ACT nasal spray, , Disp: , Rfl:    Interferon Beta-1a (REBIF REBIDOSE) 44 MCG/0.5ML SOAJ, Use as directed by physician. Give 44 mcg (0.5 ml) as a subcutaneous injection three times per week.   PROTECT FROM LIGHT, Disp: 18 mL, Rfl: 4   levothyroxine (SYNTHROID, LEVOTHROID) 25 MCG tablet, Take 25 mcg by mouth daily before breakfast., Disp: , Rfl:    lisinopril (PRINIVIL,ZESTRIL) 10 MG tablet, Take 1 tablet (10 mg total) by mouth daily., Disp: 30 tablet, Rfl: 0   LORazepam (ATIVAN) 1 MG tablet, Tale 1.2 to 1 at night, Disp: 30 tablet, Rfl: 5   mirabegron ER (MYRBETRIQ) 50 MG TB24 tablet, Take 1 tablet (50 mg total) by mouth daily., Disp: 90 tablet, Rfl: 3   Multiple Vitamin (MULTIVITAMIN) tablet, Take 1 tablet by mouth daily., Disp: , Rfl:    omeprazole (PRILOSEC) 20 MG capsule, Take 20 mg by mouth daily. , Disp: , Rfl:    oxybutynin (DITROPAN) 5 MG tablet, Take 1 tablet (5 mg total) by mouth 2 (two) times daily., Disp: 180 tablet, Rfl: 3   phentermine 37.5 MG capsule, Take 1 capsule by  mouth in the morning, Disp: 90 capsule, Rfl: 1   sertraline (ZOLOFT) 100 MG tablet, TAKE 1 TABLET(100 MG) BY MOUTH DAILY, Disp: 30 tablet, Rfl: 11   tamsulosin (FLOMAX) 0.4 MG CAPS capsule, Take 0.4 mg by mouth daily., Disp: , Rfl:    traMADol (ULTRAM) 50 MG tablet, Take 1 tablet (50 mg total) by mouth every 6 (six) hours as needed., Disp: 90 tablet, Rfl: 0  PAST MEDICAL HISTORY: Past Medical History:  Diagnosis Date   Depression    Headache    Hyperlipidemia    Hypertension    MS (multiple sclerosis) (HCC) 2011   Vision abnormalities     PAST SURGICAL HISTORY: History reviewed. No pertinent surgical history.  FAMILY HISTORY: Family History  Problem Relation Age of Onset   Hypertension Mother    Hypertension Father    Stroke Father    Diabetes Father    Heart disease Maternal Grandmother    Cancer Maternal Grandfather    Heart disease Paternal Grandmother    Asthma Paternal Grandfather  ASSESSMENT AND PLAN  Relapsing remitting multiple sclerosis (HCC) - Plan: MR BRAIN W WO CONTRAST  Vision disturbance - Plan: MR BRAIN W WO CONTRAST, MR ANGIO HEAD WO CONTRAST  Gait disturbance - Plan: MR BRAIN W WO CONTRAST  TIA (transient ischemic attack) - Plan: MR ANGIO HEAD WO CONTRAST  1.  Continue Rebif.  Due to urinary symptoms we need to check an MRI of the brain and MRA of the intracranial arteries to determine if there has been subclinical progression or if she has stenosis that might have been related to her recent symptoms.  If subclinical progression is occurring I will want to switch her from Rebif to a stronger disease modifying therapy.  Further evaluation may be necessary if she has significant stenosis 2.   Return in 6 months or sooner for new or worsening neurologic symptoms.   Follow Up Instructions:   I discussed the assessment and treatment plan with the patient. The patient was provided an opportunity to ask questions and all were answered. The patient  agreed with the plan and demonstrated an understanding of the instructions.   The patient was advised to call back or seek an in-person evaluation if the symptoms worsen or if the condition fails to improve as anticipated.  I provided 15 minutes of non-face-to-face time during this encounter.  Anabell Swint A. Epimenio Foot, MD, PhD 09/28/2021, 6:23 PM Certified in Neurology, Clinical Neurophysiology, Sleep Medicine, Pain Medicine and Neuroimaging  [facility] [address], [address] [place], [place] [address] [phone]

## 2021-09-28 NOTE — Telephone Encounter (Addendum)
Called pt back. Stumbling when she walks. Vision went blank for a min yesterday while driving and then resolved yesterday. Happened once before but because it had resolved in the past, eye doctor did not recommend seeing her. Still taking Rebif for MS. Missed 1-2 doses awhile ago, but not recently. No signs/sx of infection. Has not been around any one that has been sick. Does not have appt scheduled at this time.   Last seen 11/21/20. I was going to offer mychart VV at 3pm w/ Dr. Epimenio Foot but pt hesitant because she is unsure if we accept her new [facility] plan. She was transferred to [facility] in billing to further discuss and aware I will f/u w/ her after. She confirmed we take her insurance. She is not signed up for mychart. I sent her text to get signed up and upload her new insurance cards to. She lives over 2 hr away. Agreeable to do telephone visit. I scheduled this for 3pm.  Phone [phone].

## 2021-10-10 ENCOUNTER — Telehealth: Payer: Self-pay | Admitting: Neurology

## 2021-10-10 NOTE — Telephone Encounter (Signed)
[employer] medicare/medicaid no auth req, faxed order to [facility] they will reach out to the pt. to schedule.  Ph # [phone] Fax # [phone].

## 2021-10-11 ENCOUNTER — Telehealth: Payer: Self-pay | Admitting: *Deleted

## 2021-10-11 NOTE — Telephone Encounter (Signed)
Submitted PA Rebif Rebidose 44mch/0.5ml on CMM. Key: [id number]. Waiting on determination.

## 2021-10-19 NOTE — Telephone Encounter (Signed)
Called # on CMM  [phone] to check on status of PA. This was [employer] and they are unable to check status. Advised me to check w/ plan directly.   I called Matt Lee/[employer]. He states they submitted their own PA and it was approved 10/16/21-12/23/22. She has new coverage: Medicare Part D optumrx. ID: [id number]. Grp: [id number]. BIN: [id number]. PCN: [id number]. Phone: [phone]. Case# [id number]. Nothing further needed.

## 2021-11-29 ENCOUNTER — Telehealth: Payer: Self-pay | Admitting: *Deleted

## 2021-11-29 NOTE — Telephone Encounter (Signed)
Cd from [employer] on nurse desk

## 2022-02-12 ENCOUNTER — Other Ambulatory Visit: Payer: Self-pay | Admitting: Neurology

## 2022-02-12 NOTE — Telephone Encounter (Signed)
Controlled Drug Registry checked:  Phentermine 37.5mg  last filled on 11/05/21 for #90  Last visit: 09/28/21 Next visit: 04/03/22

## 2022-03-15 IMAGING — CT CT HEAD W/O CM
3 series · 15 of 47 positions shown, 18 images · non-contrast
Comparison: Head MRI 12/05/2018

CLINICAL DATA: Fall.

EXAM:
CT HEAD WITHOUT CONTRAST
TECHNIQUE: Contiguous axial images were obtained from the base of the skull
through the vertex without intravenous contrast.

[Series 2: head wo · axial · 0.47mm/px · z∈[+1353,+1478]mm · 9 of 30 slices shown, 12 images]
[im 3/30  brain]
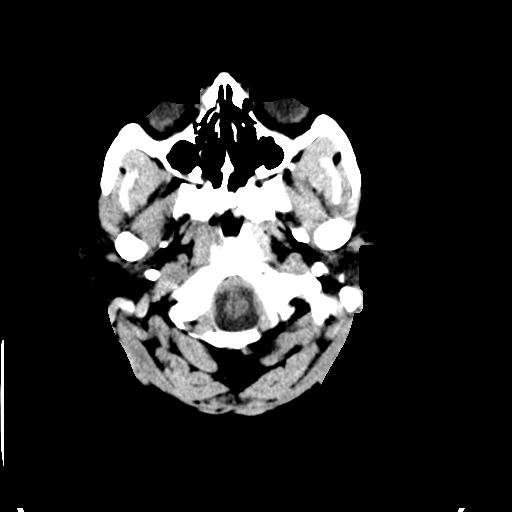
[im 3/30  bone]
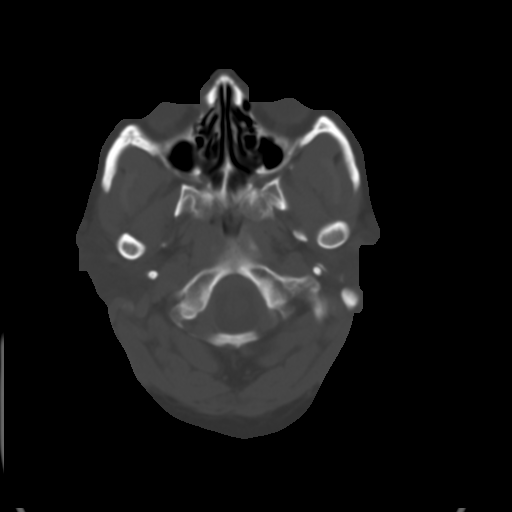
[im 6/30  brain]
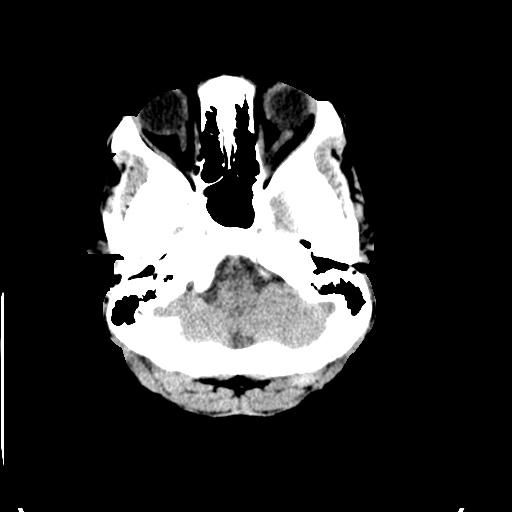
[im 9/30  brain]
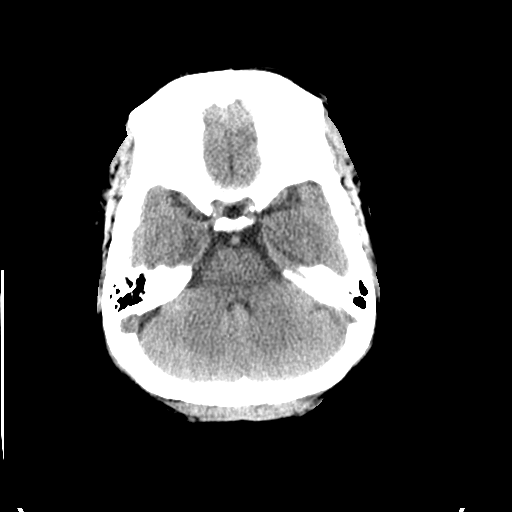
[im 12/30  brain]
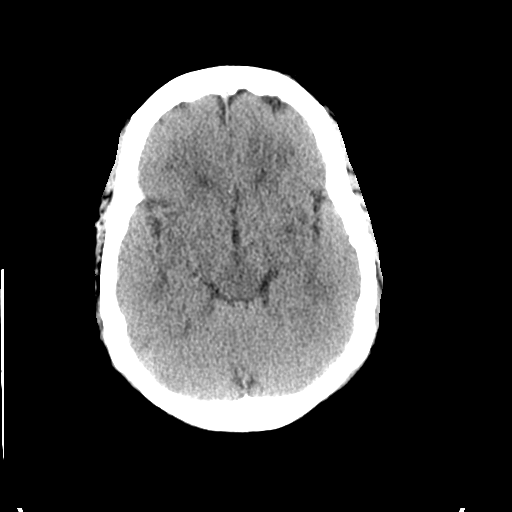
[im 16/30  brain]
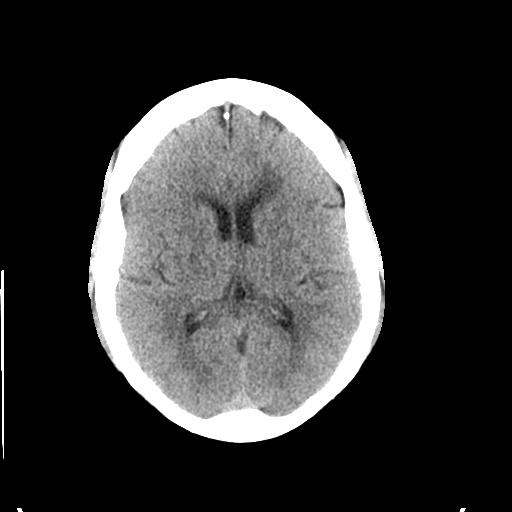
[im 16/30  bone]
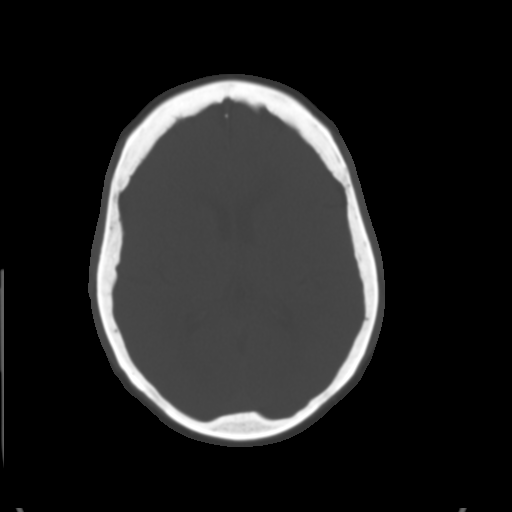
[im 19/30  brain]
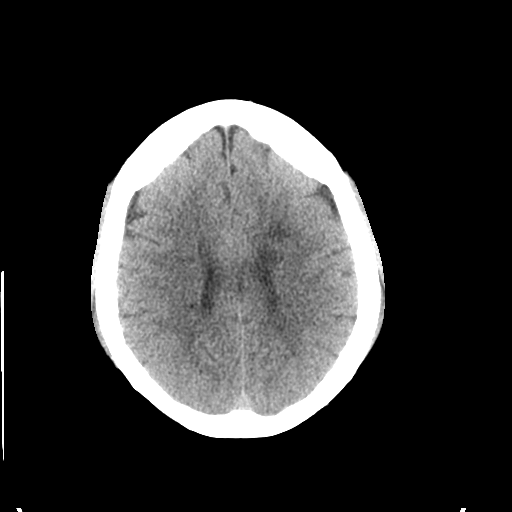
[im 22/30  brain]
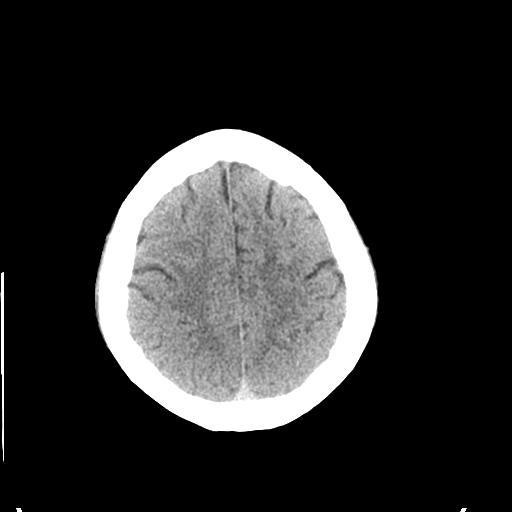
[im 25/30  brain]
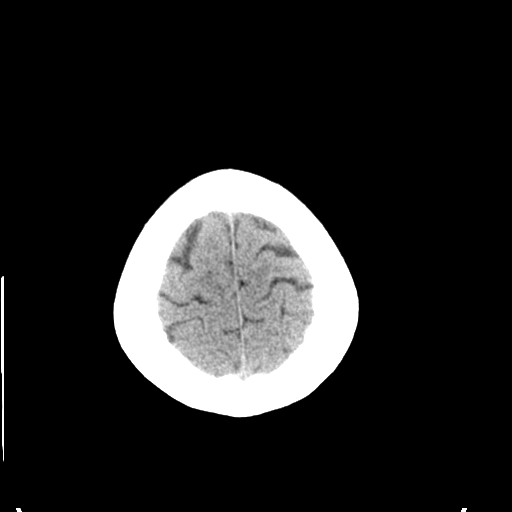
[im 28/30  brain]
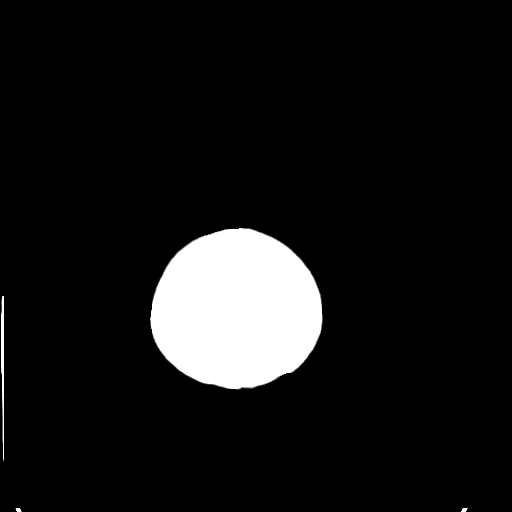
[im 28/30  bone]
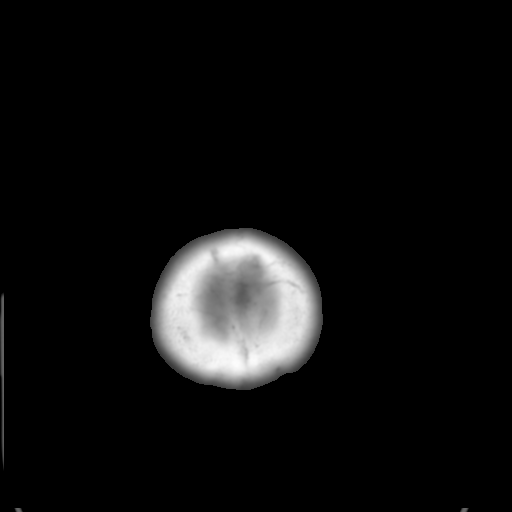

[Series 5: coronal soft tissue · coronal · 0.29mm/px · 3 of 62 slices shown]
[im 21/62  brain]
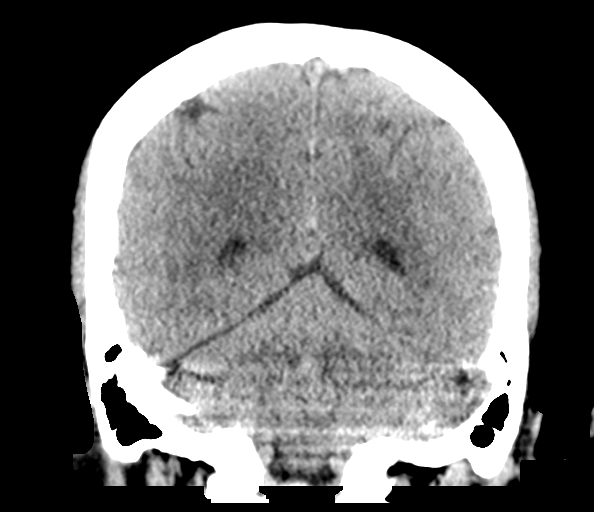
[im 28/62  brain]
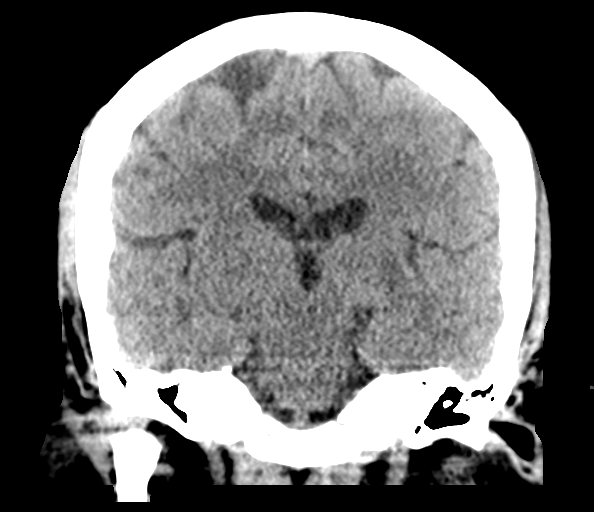
[im 34/62  brain]
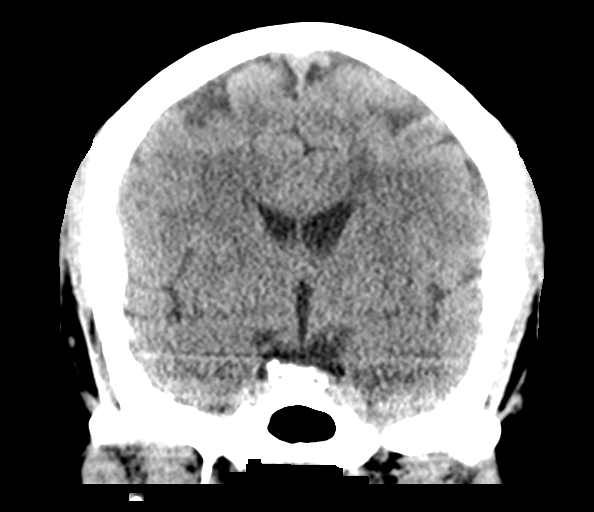

[Series 6: sagittal soft tissue · sagittal · 0.29mm/px · 3 of 58 slices shown]
[im 20/58  brain]
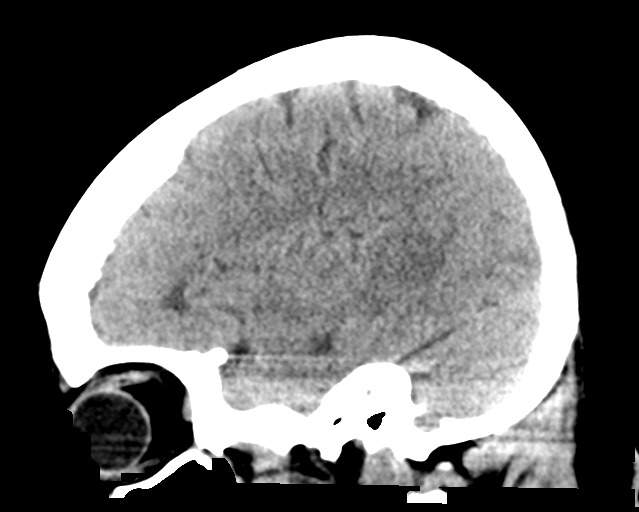
[im 29/58  brain]
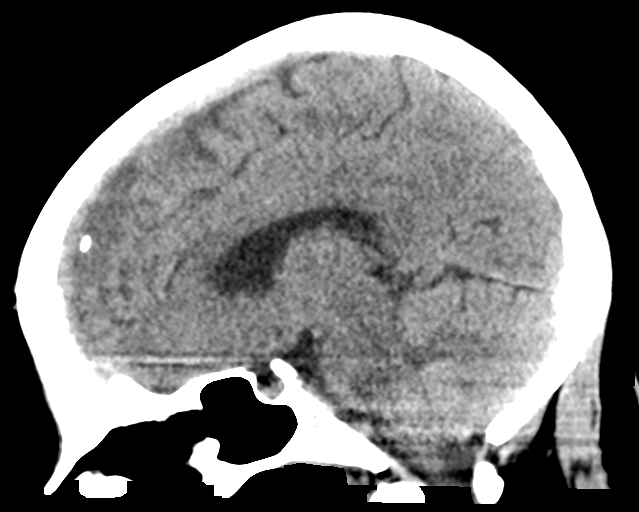
[im 39/58  brain]
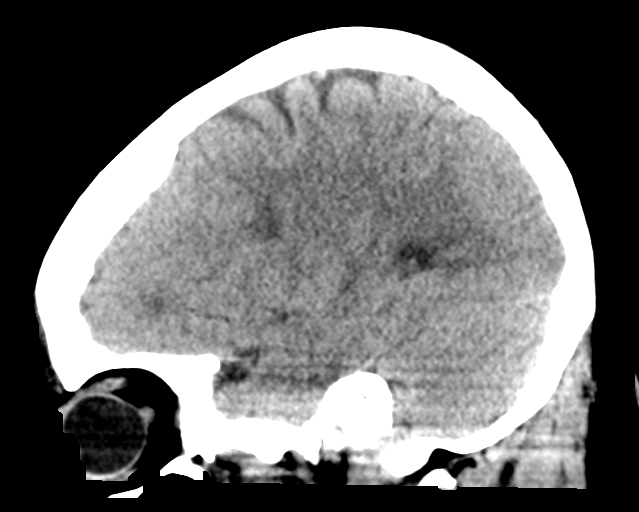

[15 of 47 positions shown; findings below may reference images not displayed]

FINDINGS: Brain: There is no evidence of acute infarct, intracranial
hemorrhage, mass, midline shift, or extra-axial fluid collection.
Moderately age advanced hypodensities in the cerebral white matter
including multiple periventricular lesions asymmetrically extending
into the left centrum semiovale are consistent with the patient's
history of multiple sclerosis. There is slight ex vacuo dilatation
of the left lateral ventricle due to white matter volume loss.

Vascular: Calcified atherosclerosis at the skull base. No hyperdense
vessel.

Skull: No fracture suspicious osseous lesion.

Sinuses/Orbits: Paranasal sinuses and mastoid air cells are clear.
Unremarkable orbits.

Other: None.
IMPRESSION: 1. No evidence of acute intracranial abnormality.
2. Chronic white matter disease related to multiple sclerosis.

## 2022-04-03 ENCOUNTER — Encounter: Payer: Self-pay | Admitting: Neurology

## 2022-04-03 ENCOUNTER — Ambulatory Visit (INDEPENDENT_AMBULATORY_CARE_PROVIDER_SITE_OTHER): Payer: 59 | Admitting: Neurology

## 2022-04-03 VITALS — BP 205/97 | HR 75 | Ht 64.0 in | Wt 228.4 lb

## 2022-04-03 DIAGNOSIS — I669 Occlusion and stenosis of unspecified cerebral artery: Secondary | ICD-10-CM | POA: Diagnosis not present

## 2022-04-03 DIAGNOSIS — R5383 Other fatigue: Secondary | ICD-10-CM

## 2022-04-03 DIAGNOSIS — Z79899 Other long term (current) drug therapy: Secondary | ICD-10-CM | POA: Diagnosis not present

## 2022-04-03 DIAGNOSIS — I10 Essential (primary) hypertension: Secondary | ICD-10-CM

## 2022-04-03 DIAGNOSIS — G35 Multiple sclerosis: Secondary | ICD-10-CM

## 2022-04-03 DIAGNOSIS — M25552 Pain in left hip: Secondary | ICD-10-CM

## 2022-04-03 DIAGNOSIS — M25551 Pain in right hip: Secondary | ICD-10-CM

## 2022-04-03 MED ORDER — LISINOPRIL 10 MG PO TABS: 10.0000 mg | ORAL_TABLET | Freq: Every day | ORAL | 0 refills | Status: AC

## 2022-04-03 MED ORDER — VENLAFAXINE HCL ER 150 MG PO CP24: 150.0000 mg | ORAL_CAPSULE | Freq: Every day | ORAL | 3 refills | Status: DC

## 2022-04-03 MED ORDER — TRAMADOL HCL 50 MG PO TABS: 50.0000 mg | ORAL_TABLET | Freq: Four times a day (QID) | ORAL | 0 refills | Status: DC | PRN

## 2022-04-03 NOTE — Progress Notes (Addendum)
? ?[facility] ? ?PATIENT: Joanne Parker ?DOB: Nov 07, 1961 ? ?REFERRING DOCTOR OR PCP:  Garner Nash ? ?SOURCE: Patient ? ?_________________________________ ? ? ?HISTORICAL ? ?CHIEF COMPLAINT:  ?Chief Complaint  ?Patient presents with  ? Follow-up  ?  RM 14.  MS-Rebif. No new sx. Doing well on Rebif.   ? ? ?HISTORY OF PRESENT ILLNESS:  ?Joanne Parker is a 61 y.o. woman with MS diagnosed in 2011.    ? ?Update 04/03/2022: ?She is on Rebif for MS and denies new exacerbations.    She tolerates it well.    ? ?She his walking about the same with reduced balance but no falls.   She uses the rails on stairs.   She has mild leg weakness and mild spasticity.     She also has some numbness in the hands and feet at times though the tingling is usually not painful.  Vision is about the same.     She is having less urinary urgency and occasional incontinence, not always with urge.    She is on Oxybutynin and has a dry mouth.  She does not think she had much more benefit with Myrbetriq.     ? ?She reports a lot of fatigue that is physical more than cognitive.  She tried phentermine for weight loss and fatigue/sleep and had some benefit.   Phentermine has helped her fatigue more tan Provigil.  It also has helped her lose a little weight.     he has mild OSA and was hoping to avoid CPAP.  We have discussed weight loss and an oral appliance. .   ? ?She had a possible TIA in October 2022 and we ordered MRI/MRA.   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   She has been told cholesterol was mildly high in the past.  I personally reviewed the images (see below.  There were no acute findings. ? ? ?MS History:   She began to experience difficulties with her gait in December 2010. She felt the onset of gait disorder was fairly sudden. She noted bilateral leg clumsiness also noted that her handwriting was worse (right-handed). An MRI of the brain was performed on 02/12/2010 showed multiple white matter foci including one enhancing  lesion in the left posterior limb of the internal capsule. Lesions were infratentorial and supratentorial with many in the periventricular white matter. Of note, in 2009 she had noted numbness in one of her legs that lasted for 2 or 3 weeks after she was placed on a muscle relaxant. I saw her on 12/28/2009 and felt the MRI was very consistent with MS. She received a 3 day course of IV steroids and was feeling better by the third day. She chose to start Rebif and tolerated it fairly well when she was reevaluated 6 weeks later.   Repeat MRI?s of the brain 01/2011 and 10/13/2012 showed no new foci.   She hasd an exacerbation with reduced gait and worsening bladder function June 2017.    ? ?IMAGING personally reviewed today: ?MRI/MRA 11/20/2021 ?There were no acute findings.  T2/FLAIR hyperintense foci in the periventricular, deep and juxtacortical white matter are consistent with her history of MS.  Some of the foci in the pons and basal ganglia could also represent chronic ischemic change.  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 No branch occlusion or other stenosis. ? ? ? ?Imaging: ?MRI brain  [facility]; [place]) 06/08/2020.    MRI brain showed periventricular white  matter disease and scattered white matter hyperintensities extending to the subcortical regions bilaterally. These are oriented perpendicular to the ventricles.     ? ?MRI cervical spine 06/08/2020 showed a normal spinal cord and multilevel DJD/DDD with protrusions at C3C4 and C5C6 with compression of the thecal sac.   ? ?MRI brain 12/05/2018 showed Multiple T2/flair hyperintense foci in the hemispheres and pons in a pattern and configuration consistent with chronic demyelinating plaque associated with multiple sclerosis.  None of the foci appears to be acute.  When compared to the MRI dated 07/02/2016, no definite interval change. ? ? ?REVIEW OF SYSTEMS: ?Constitutional: No fevers, chills, sweats, or change in appetite.   She notes fatigue. She sleeps well at night. ?Eyes: No visual changes, double vision, eye pain ?Ear, nose and throat: No hearing loss, ear pain, nasal congestion, sore throat ?Cardiovascular: No chest pain, palpitations ?Respiratory:  No shortness of breath at rest or with exertion.   No wheezes.  She snores ?GastrointestinaI: No nausea, vomiting, diarrhea, abdominal pain, fecal incontinence ?Genitourinary:  No dysuria, urinary retention.   Severe urinary frequency with nocturia.  Recent incontinence has been worse. ?Musculoskeletal:  No neck pain, back pain.   She is having less left knee pain ?Integumentary: No rash, pruritus, skin lesions ?Neurological: as above.    Has occipital headaches at times, worse when has busier day.    ?Psychiatric: Notes depression and anxiety ?Endocrine: No palpitations, diaphoresis, change in appetite, change in weigh or increased thirst ?Hematologic/Lymphatic:  No anemia, purpura, petechiae. ?Allergic/Immunologic: No itchy/runny eyes, nasal congestion, recent allergic reactions, rashes ? ?ALLERGIES: ?No Known Allergies ? ?HOME MEDICATIONS: ? ?Current Outpatient Medications:  ?  acetaminophen (TYLENOL) 500 MG tablet, Take 500 mg by mouth every 6 (six) hours as needed. OTC, Disp: , Rfl:  ?  bisoprolol-hydrochlorothiazide (ZIAC) 10-6.25 MG per tablet, Take 2 tablets by mouth daily., Disp: 60 tablet, Rfl: 5 ?  cholecalciferol (VITAMIN D) 1000 UNITS tablet, Take 5,000 Units by mouth daily. , Disp: , Rfl:  ?  fluticasone (FLONASE) 50 MCG/ACT nasal spray, , Disp: , Rfl:  ?  Interferon Beta-1a (REBIF REBIDOSE) 44 MCG/0.5ML SOAJ, Use as directed by physician. Give 44 mcg (0.5 ml) as a subcutaneous injection three times per week.   PROTECT FROM LIGHT, Disp: 18 mL, Rfl: 4 ?  levothyroxine (SYNTHROID, LEVOTHROID) 25 MCG tablet, Take 25 mcg by mouth daily before breakfast., Disp: , Rfl:  ?  lisinopril (PRINIVIL,ZESTRIL) 10 MG tablet, Take 1 tablet (10 mg total) by mouth daily., Disp: 30 tablet,  Rfl: 0 ?  LORazepam (ATIVAN) 1 MG tablet, Tale 1.2 to 1 at night, Disp: 30 tablet, Rfl: 5 ?  mirabegron ER (MYRBETRIQ) 50 MG TB24 tablet, Take 1 tablet (50 mg total) by mouth daily., Disp: 90 tablet, Rfl: 3 ?  Multiple Vitamin (MULTIVITAMIN) tablet, Take 1 tablet by mouth daily., Disp: , Rfl:  ?  omeprazole (PRILOSEC) 20 MG capsule, Take 20 mg by mouth daily. , Disp: , Rfl:  ?  oxybutynin (DITROPAN) 5 MG tablet, Take 1 tablet (5 mg total) by mouth 2 (two) times daily., Disp: 180 tablet, Rfl: 3 ?  phentermine 37.5 MG capsule, Take 1 capsule by mouth in the morning, Disp: 90 capsule, Rfl: 0 ?  venlafaxine XR (EFFEXOR XR) 150 MG 24 hr capsule, Take 1 capsule (150 mg total) by mouth daily with breakfast., Disp: 90 capsule, Rfl: 3 ?  traMADol (ULTRAM) 50 MG tablet, Take 1 tablet (50 mg total) by mouth every 6 (six)  hours as needed., Disp: 90 tablet, Rfl: 0 ? ?PAST MEDICAL HISTORY: ?Past Medical History:  ?Diagnosis Date  ? Depression   ? Headache   ? Hyperlipidemia   ? Hypertension   ? MS (multiple sclerosis) (HCC) 2011  ? Vision abnormalities   ? ? ?PAST SURGICAL HISTORY: ?No past surgical history on file. ? ?FAMILY HISTORY: ?Family History  ?Problem Relation Age of Onset  ? Hypertension Mother   ? Hypertension Father   ? Stroke Father   ? Diabetes Father   ? Heart disease Maternal Grandmother   ? Cancer Maternal Grandfather   ? Heart disease Paternal Grandmother   ? Asthma Paternal Grandfather   ? ? ?SOCIAL HISTORY: ? ?Social History  ? ?Socioeconomic History  ? Marital status: Single  ?  Spouse name: N/A  ? Number of children: 0  ? Years of education: 59  ? Highest education level: Not on file  ?Occupational History  ? Occupation: Armed forces technical officer at a [employer]  ?Tobacco Use  ? Smoking status: Never  ? Smokeless tobacco: Never  ?Vaping Use  ? Vaping Use: Never used  ?Substance and Sexual Activity  ? Alcohol use: Yes  ?  Alcohol/week: 0.0 standard drinks  ?  Comment: occasional  ? Drug use: No  ? Sexual activity:  Yes  ?  Partners: Male  ?  Birth control/protection: Condom  ?Other Topics Concern  ? Not on file  ?Social History Narrative  ? Lives alone.  ? ?Social Determinants of Health  ? ?Occupational hygienist

## 2022-04-04 ENCOUNTER — Other Ambulatory Visit: Payer: Self-pay | Admitting: Neurology

## 2022-04-04 LAB — CBC WITH DIFFERENTIAL/PLATELET
Basophils Absolute: 0 10*3/uL (ref 0.0–0.2)
Basos: 0 %
EOS (ABSOLUTE): 0.1 10*3/uL (ref 0.0–0.4)
Eos: 1 %
Hematocrit: 40.8 % (ref 34.0–46.6)
Hemoglobin: 12.8 g/dL (ref 11.1–15.9)
Immature Grans (Abs): 0 10*3/uL (ref 0.0–0.1)
Immature Granulocytes: 0 %
Lymphocytes Absolute: 1.8 10*3/uL (ref 0.7–3.1)
Lymphs: 22 %
MCH: 24.7 pg — ABNORMAL LOW (ref 26.6–33.0)
MCHC: 31.4 g/dL — ABNORMAL LOW (ref 31.5–35.7)
MCV: 79 fL (ref 79–97)
Monocytes Absolute: 0.6 10*3/uL (ref 0.1–0.9)
Monocytes: 7 %
Neutrophils Absolute: 5.7 10*3/uL (ref 1.4–7.0)
Neutrophils: 70 %
Platelets: 290 10*3/uL (ref 150–450)
RBC: 5.18 x10E6/uL (ref 3.77–5.28)
RDW: 13.8 % (ref 11.7–15.4)
WBC: 8.2 10*3/uL (ref 3.4–10.8)

## 2022-04-04 LAB — COMPREHENSIVE METABOLIC PANEL
ALT: 15 IU/L (ref 0–32)
AST: 21 IU/L (ref 0–40)
Albumin/Globulin Ratio: 1.5 (ref 1.2–2.2)
Albumin: 4.2 g/dL (ref 3.8–4.9)
Alkaline Phosphatase: 109 IU/L (ref 44–121)
BUN/Creatinine Ratio: 14 (ref 9–23)
BUN: 28 mg/dL — ABNORMAL HIGH (ref 6–24)
Bilirubin Total: 0.4 mg/dL (ref 0.0–1.2)
CO2: 27 mmol/L (ref 20–29)
Calcium: 9.8 mg/dL (ref 8.7–10.2)
Chloride: 102 mmol/L (ref 96–106)
Creatinine, Ser: 2.02 mg/dL — ABNORMAL HIGH (ref 0.57–1.00)
Globulin, Total: 2.8 g/dL (ref 1.5–4.5)
Glucose: 79 mg/dL (ref 70–99)
Potassium: 4.8 mmol/L (ref 3.5–5.2)
Sodium: 141 mmol/L (ref 134–144)
Total Protein: 7 g/dL (ref 6.0–8.5)
eGFR: 28 mL/min/{1.73_m2} — ABNORMAL LOW (ref >59)

## 2022-04-04 LAB — LIPID PANEL
Chol/HDL Ratio: 5.4 ratio — ABNORMAL HIGH (ref 0.0–4.4)
Cholesterol, Total: 241 mg/dL — ABNORMAL HIGH (ref 100–199)
HDL: 45 mg/dL (ref >39)
LDL Chol Calc (NIH): 182 mg/dL — ABNORMAL HIGH (ref 0–99)
Triglycerides: 83 mg/dL (ref 0–149)
VLDL Cholesterol Cal: 14 mg/dL (ref 5–40)

## 2022-04-04 LAB — TSH: TSH: 1.5 u[IU]/mL (ref 0.450–4.500)

## 2022-04-04 MED ORDER — ATORVASTATIN CALCIUM 40 MG PO TABS: 40.0000 mg | ORAL_TABLET | Freq: Every day | ORAL | 3 refills | Status: AC

## 2022-04-30 ENCOUNTER — Other Ambulatory Visit: Payer: Self-pay

## 2022-04-30 DIAGNOSIS — G35 Multiple sclerosis: Secondary | ICD-10-CM

## 2022-04-30 MED ORDER — REBIF REBIDOSE 44 MCG/0.5ML ~~LOC~~ SOAJ: SUBCUTANEOUS | 3 refills | Status: DC

## 2022-05-18 ENCOUNTER — Telehealth: Payer: Self-pay | Admitting: Neurology

## 2022-05-18 NOTE — Telephone Encounter (Signed)
I personally reviewed the MRI of the brain and MR angiogram from 11/20/2021 (performed at [facility])  The MRI of the brain showed no acute findings.  There were lacunar-like changes/chronic ischemic change in the pons, thalamus and basal ganglia and T2/FLAIR hyperintense foci elsewhere, many in the periventricular white matter with a configuration consistent with MS.  MR angiogram showed mild stenosis of the right supraclinoid segment of the internal carotid artery and the M1 segment of the middle cerebral artery on the right.  There did not appear to be hemodynamically significant stenosis.  Incidental note is made of a diminutive right A1 segment with dominant left A1 segment.

## 2022-07-16 ENCOUNTER — Other Ambulatory Visit: Payer: Self-pay | Admitting: Neurology

## 2022-07-16 NOTE — Telephone Encounter (Signed)
Last OV was on 04/03/22.  Next OV is scheduled for 10/25/22.  Last RX was written on 06/07/22 for 30 tabs.   [facility] has been reviewed.

## 2022-10-04 ENCOUNTER — Other Ambulatory Visit: Payer: Self-pay | Admitting: Neurology

## 2022-10-04 DIAGNOSIS — M25552 Pain in left hip: Secondary | ICD-10-CM

## 2022-10-04 NOTE — Telephone Encounter (Signed)
Pt last appt was on 4/11 and has an up coming appt on 11/2. Last refill was on registry on 7/24.

## 2022-10-16 ENCOUNTER — Telehealth: Payer: Self-pay | Admitting: Neurology

## 2022-10-16 NOTE — Telephone Encounter (Signed)
..   Pt understands that although there may be some limitations with this type of visit, we will take all precautions to reduce any security or privacy concerns.  Pt understands that this will be treated like an in office visit and we will file with pt's insurance, and there may be a patient responsible charge related to this service. ? ?

## 2022-10-25 ENCOUNTER — Encounter: Payer: Self-pay | Admitting: Neurology

## 2022-10-25 ENCOUNTER — Telehealth (INDEPENDENT_AMBULATORY_CARE_PROVIDER_SITE_OTHER): Payer: 59 | Admitting: Neurology

## 2022-10-25 DIAGNOSIS — E7800 Pure hypercholesterolemia, unspecified: Secondary | ICD-10-CM

## 2022-10-25 DIAGNOSIS — I10 Essential (primary) hypertension: Secondary | ICD-10-CM

## 2022-10-25 DIAGNOSIS — R269 Unspecified abnormalities of gait and mobility: Secondary | ICD-10-CM

## 2022-10-25 DIAGNOSIS — I669 Occlusion and stenosis of unspecified cerebral artery: Secondary | ICD-10-CM | POA: Diagnosis not present

## 2022-10-25 DIAGNOSIS — H539 Unspecified visual disturbance: Secondary | ICD-10-CM

## 2022-10-25 DIAGNOSIS — R5383 Other fatigue: Secondary | ICD-10-CM | POA: Diagnosis not present

## 2022-10-25 DIAGNOSIS — Z79899 Other long term (current) drug therapy: Secondary | ICD-10-CM

## 2022-10-25 DIAGNOSIS — G35 Multiple sclerosis: Secondary | ICD-10-CM

## 2022-10-25 MED ORDER — OXYBUTYNIN CHLORIDE 5 MG PO TABS: 5.0000 mg | ORAL_TABLET | Freq: Two times a day (BID) | ORAL | 3 refills | Status: DC

## 2022-10-25 MED ORDER — ESCITALOPRAM OXALATE 20 MG PO TABS: 20.0000 mg | ORAL_TABLET | Freq: Every day | ORAL | 3 refills | Status: DC

## 2022-10-25 MED ORDER — PHENTERMINE HCL 37.5 MG PO CAPS: 37.5000 mg | ORAL_CAPSULE | Freq: Every morning | ORAL | 1 refills | Status: DC

## 2022-10-25 NOTE — Progress Notes (Signed)
[facility]  PATIENT: Joanne Parker DOB: 1961/04/24  REFERRING DOCTOR OR PCP:  Maura Crandall  SOURCE: Patient  _________________________________   HISTORICAL  CHIEF COMPLAINT:  No chief complaint on file.   HISTORY OF PRESENT ILLNESS:  [name] is a 61 y.o. woman with MS diagnosed in 2011.     Virtual Visit via Video Note I connected with Annabelle Harman  on 10/25/22 at  3:30 PM EDT by a video enabled telemedicine application and verified that I am speaking with the correct person.  I discussed the limitations of evaluation and management by telemedicine and the availability of in person appointments. The patient expressed understanding and agreed to proceed.  Patient was in her car.  Provider was in the office.  Update 10/25/2022. She is on Rebif for MS and denies new exacerbations.    She tolerates it well.   No injection site or systemic s.e.     She has mildly reduced balance but no falls.   She uses the rails on stairs.   She has mild leg weakness and mild spasticity.     She also has some numbness in the hands and feet at times though the tingling is usually not painful.  Vision is about the same.     She is having urinary urgency and occasional incontinence,    She is on Oxybutynin with mild benefit and has a dry mouth.  Not much benefit with Myrbetriq.      She reports a lot of fatigue that is physical more than cognitive.  She tried phentermine for weight loss and fatigue/sleep with some benefit.   Phentermine has helped her fatigue more tan Provigil.  It also has helped her lose a little weight.    She has mild OSA and was hoping to avoid CPAP.  We have discussed weight loss and an oral appliance. .    She had a possible visual TIA in October  2022 and we ordered MRI/MRA.   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   She has been told cholesterol was mildly high in the past.  There were no acute findings.  She is taking ASA  She reports pain in her legs,  helped by tramadol/Tylenol. She just takes rarely (couple times a month) She has had more depression the past month.  She was on Effexor but it was not well tolerated.  She also feels more forgetful  PE: She is a well-developed well-nourished woman in no acute distress.  The head is normocephalic and atraumatic.  Sclera are anicteric.  Visible skin appears normal.  The neck has a good range of motion.   She is alert and fully oriented with fluent speech and good attention, knowledge and memory.  Extraocular muscles are intact.  Facial strength is normal.    She appears to have normal strength in the arms.  Rapid alternating movements and finger-nose-finger are performed well.   MS History:    She began to experience difficulties with her gait in December 2010. She felt the onset of gait disorder was fairly sudden. She noted bilateral leg clumsiness also noted that her handwriting was worse (right-handed). An MRI of the brain was performed on 02/12/2010 showed multiple white matter foci including one enhancing lesion in the left posterior limb of the internal capsule. Lesions were infratentorial and supratentorial with many in the periventricular white matter. Of note, in 2009 she had noted numbness in one of her legs that lasted for  2 or 3 weeks after she was placed on a muscle relaxant. I saw her on 12/28/2009 and felt the MRI was very consistent with MS. she had an exacerbation affecting gait and bladder in 2017.  IMAGING  MRI/MRA 11/20/2021 There were no acute findings.  T2/FLAIR hyperintense foci in the periventricular, deep and juxtacortical white matter are consistent with her history of MS.  Some of the foci in the pons and basal ganglia could also represent chronic ischemic change.  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 No branch occlusion or other stenosis.  MRI brain  [facility]; [place]) 06/08/2020.    MRI brain showed periventricular white matter disease  and scattered white matter hyperintensities extending to the subcortical regions bilaterally. These are oriented perpendicular to the ventricles.      MRI cervical spine 06/08/2020 showed a normal spinal cord and multilevel DJD/DDD with protrusions at [facility] and C5C6 with compression of the thecal sac.    MRI brain 12/05/2018 showed Multiple T2/flair hyperintense foci in the hemispheres and pons in a pattern and configuration consistent with chronic demyelinating plaque associated with multiple sclerosis.  None of the foci appears to be acute.  When compared to the MRI dated 07/02/2016, no definite interval change.   REVIEW OF SYSTEMS: Constitutional: No fevers, chills, sweats, or change in appetite.  She notes fatigue. She sleeps well at night. Eyes: No visual changes, double vision, eye pain Ear, nose and throat: No hearing loss, ear pain, nasal congestion, sore throat Cardiovascular: No chest pain, palpitations Respiratory:  No shortness of breath at rest or with exertion.   No wheezes.  She snores GastrointestinaI: No nausea, vomiting, diarrhea, abdominal pain, fecal incontinence Genitourinary:  No dysuria, urinary retention.   Severe urinary frequency with nocturia.  Recent incontinence has been worse. Musculoskeletal:  No neck pain, back pain.   She is having less left knee pain Integumentary: No rash, pruritus, skin lesions Neurological: as above.    Has occipital headaches at times, worse when has busier day.    Psychiatric: Notes depression and anxiety Endocrine: No palpitations, diaphoresis, change in appetite, change in weigh or increased thirst Hematologic/Lymphatic:  No anemia, purpura, petechiae. Allergic/Immunologic: No itchy/runny eyes, nasal congestion, recent allergic reactions, rashes  ALLERGIES: No Known Allergies  HOME MEDICATIONS:  Current Outpatient Medications:    acetaminophen (TYLENOL) 500 MG tablet, Take 500 mg by mouth every 6 (six) hours as needed. OTC, Disp: ,  Rfl:    atorvastatin (LIPITOR) 40 MG tablet, Take 1 tablet (40 mg total) by mouth daily., Disp: 90 tablet, Rfl: 3   bisoprolol-hydrochlorothiazide (ZIAC) 10-6.25 MG per tablet, Take 2 tablets by mouth daily., Disp: 60 tablet, Rfl: 5   cholecalciferol (VITAMIN D) 1000 UNITS tablet, Take 5,000 Units by mouth daily. , Disp: , Rfl:    fluticasone (FLONASE) 50 MCG/ACT nasal spray, , Disp: , Rfl:    Interferon Beta-1a (REBIF REBIDOSE) 44 MCG/0.5ML SOAJ, Use as directed by physician. Give 44 mcg (0.5 ml) as a subcutaneous injection three times per week.   PROTECT FROM LIGHT, Disp: 18 mL, Rfl: 3   levothyroxine (SYNTHROID, LEVOTHROID) 25 MCG tablet, Take 25 mcg by mouth daily before breakfast., Disp: , Rfl:    lisinopril (ZESTRIL) 10 MG tablet, Take 1 tablet (10 mg total) by mouth daily., Disp: 1 tablet, Rfl: 0   LORazepam (ATIVAN) 1 MG tablet, Tale 1.2 to 1 at night, Disp: 30 tablet, Rfl: 5   Multiple Vitamin (MULTIVITAMIN) tablet, Take 1 tablet  by mouth daily., Disp: , Rfl:    omeprazole (PRILOSEC) 20 MG capsule, Take 20 mg by mouth daily. , Disp: , Rfl:    oxybutynin (DITROPAN) 5 MG tablet, Take 1 tablet (5 mg total) by mouth 2 (two) times daily., Disp: 180 tablet, Rfl: 3   phentermine 37.5 MG capsule, Take 1 capsule by mouth in the morning, Disp: 90 capsule, Rfl: 0   traMADol (ULTRAM) 50 MG tablet, TAKE 1 TABLET BY MOUTH EVERY 6 HOURS AS NEEDED, Disp: 90 tablet, Rfl: 0  PAST MEDICAL HISTORY: Past Medical History:  Diagnosis Date   Depression    Headache    Hyperlipidemia    Hypertension    MS (multiple sclerosis) (HCC) 2011   Vision abnormalities     PAST SURGICAL HISTORY: No past surgical history on file.  FAMILY HISTORY: Family History  Problem Relation Age of Onset   Hypertension Mother    Hypertension Father    Stroke Father    Diabetes Father    Heart disease Maternal Grandmother    Cancer Maternal Grandfather    Heart disease Paternal Grandmother    Asthma Paternal  Grandfather     SOCIAL HISTORY:  Social History   Socioeconomic History   Marital status: Single    Spouse name: N/A   Number of children: 0   Years of education: 16   Highest education level: Not on file  Occupational History   Occupation: Armed forces technical officer at a [employer]  Tobacco Use   Smoking status: Never   Smokeless tobacco: Never  Vaping Use   Vaping Use: Never used  Substance and Sexual Activity   Alcohol use: Yes    Alcohol/week: 0.0 standard drinks of alcohol    Comment: occasional   Drug use: No   Sexual activity: Yes    Partners: Male    Birth control/protection: Condom  Other Topics Concern   Not on file  Social History Narrative   Lives alone.   Social Determinants of Health   Financial Resource Strain: Not on file  Food Insecurity: Not on file  Transportation Needs: Not on file  Physical Activity: Not on file  Stress: Not on file  Social Connections: Not on file  Intimate Partner Violence: Not on file     PHYSICAL EXAM from 04/03/2022  There were no vitals filed for this visit.   There is no height or weight on file to calculate BMI.  Repeat blood pressure at 10:30 AM: 185/90  General: The patient is well-developed and well-nourished and in no acute distress  Musculoskeletal:  Back is nontender.   She is now non-tender at the left knee.  Neurologic Exam  Mental status: The patient is alert and oriented x 3 at the time of the examination. The patient has apparent normal recent and remote memory, with an apparently normal attention span and concentration ability.   Speech is normal.  Cranial nerves: Extraocular movements are full. Facial strength and sensation were fine.  Sternocleidomastoid and trapezius are strong no obvious hearing deficits are noted.  Motor:  Muscle bulk is normal.   The muscle tone is increased in her legs mildly.  Strength is 5/5 in the arms and legs   Sensory: He has intact sensation to touch temperature and  vibration in the arms and legs..  Coordination: Finger-nose-finger is performed well and heel-to-shin is mildly reduced bilaterally.  Gait and station: Station is normal.   Her gait is mildly wide and mildly spastic.  Tandem gait is  poor and wide based. Romberg is negative.   Reflexes: Deep tendon reflexes are symmetric and normal bilaterally.        ASSESSMENT AND PLAN  Multiple sclerosis (HCC)  Asymptomatic stenosis of intracranial artery  Other fatigue  High risk medication use  Vision disturbance  Gait disturbance  Essential hypertension  Pure hypercholesterolemia   1.  Continue Rebif.  She will be seeing her primary care in a couple weeks and should get lab work there.   2.  Renew phentermine 37.5 mg daily for MS related fatigue and weight gain.    Weight loss will also help OSA.   3.   Continue bASA as has intracranial stenosis.  Continue Lipitor for hyperlipidemia and blood pressure medication 4.   Lexapro for depression.   5.   Return in 6 months or sooner for new or worsening neurologic symptoms.   Follow Up Instructions: I discussed the assessment and treatment plan with the patient. The patient was provided an opportunity to ask questions and all were answered. The patient agreed with the plan and demonstrated an understanding of the instructions.    The patient was advised to call back or seek an in-person evaluation if the symptoms worsen or if the condition fails to improve as anticipated.  I provided 27 minutes of non-face-to-face time during this encounter.  Pam Vanalstine A. Epimenio Foot, MD, PhD 10/25/2022, 4:01 PM Certified in Neurology, Clinical Neurophysiology, Sleep Medicine, Pain Medicine and Neuroimaging  [facility] [address] [place], [place] [address] [phone]

## 2022-11-04 ENCOUNTER — Other Ambulatory Visit: Payer: Self-pay | Admitting: Neurology

## 2022-11-04 DIAGNOSIS — G35 Multiple sclerosis: Secondary | ICD-10-CM

## 2022-12-28 ENCOUNTER — Telehealth: Payer: Self-pay | Admitting: *Deleted

## 2022-12-28 NOTE — Telephone Encounter (Signed)
Received [place] request for Rebif on covermymeds. Key: [id number]. Tried submitting but received the following response: "This medication or product was previously approved on [id number]_1 from 2022-12-24 to 2023-12-24. **Please note: This request was submitted electronically. Formulary lowering, tiering exception, cost reduction and/or pre-benefit determination review (including prospective Medicare hospice reviews) requests cannot be requested using this method of submission. Providers contact us at [phone] for further assistance."

## 2023-06-10 ENCOUNTER — Telehealth: Payer: Self-pay | Admitting: Neurology

## 2023-06-10 NOTE — Telephone Encounter (Signed)
Pt called back. Scheduled her with Dr. Epimenio Foot on 6/24 @ 9 am.

## 2023-06-10 NOTE — Telephone Encounter (Signed)
Noted  

## 2023-06-10 NOTE — Telephone Encounter (Signed)
Pt stated she needs for Dr. Epimenio Foot to call her in some LORazepam (ATIVAN) 1 MG tablet. Stated she needs to talk to nurse because she have something going on.

## 2023-06-10 NOTE — Telephone Encounter (Signed)
Called and LVM for pt to call office. Last seen 10/25/22 and next follow up 10/10/23. (Supposed to come back in 6 months per last OV note-around 04/25/23). She is past due for appt.  Phone room: if pt calls, let her know it would be best for her to come in for appt. Can offer 06/17/23 at 9am with Dr. Epimenio Foot

## 2023-06-17 ENCOUNTER — Encounter: Payer: Self-pay | Admitting: Neurology

## 2023-06-17 ENCOUNTER — Ambulatory Visit (INDEPENDENT_AMBULATORY_CARE_PROVIDER_SITE_OTHER): Payer: 59 | Admitting: Neurology

## 2023-06-17 VITALS — BP 147/78 | HR 62 | Ht 64.0 in | Wt 222.0 lb

## 2023-06-17 DIAGNOSIS — G35 Multiple sclerosis: Secondary | ICD-10-CM

## 2023-06-17 DIAGNOSIS — F419 Anxiety disorder, unspecified: Secondary | ICD-10-CM | POA: Diagnosis not present

## 2023-06-17 DIAGNOSIS — Z79899 Other long term (current) drug therapy: Secondary | ICD-10-CM | POA: Diagnosis not present

## 2023-06-17 DIAGNOSIS — I669 Occlusion and stenosis of unspecified cerebral artery: Secondary | ICD-10-CM

## 2023-06-17 DIAGNOSIS — R5383 Other fatigue: Secondary | ICD-10-CM | POA: Diagnosis not present

## 2023-06-17 DIAGNOSIS — E669 Obesity, unspecified: Secondary | ICD-10-CM

## 2023-06-17 DIAGNOSIS — R269 Unspecified abnormalities of gait and mobility: Secondary | ICD-10-CM

## 2023-06-17 DIAGNOSIS — F32A Depression, unspecified: Secondary | ICD-10-CM

## 2023-06-17 DIAGNOSIS — I10 Essential (primary) hypertension: Secondary | ICD-10-CM

## 2023-06-17 DIAGNOSIS — E7800 Pure hypercholesterolemia, unspecified: Secondary | ICD-10-CM

## 2023-06-17 MED ORDER — LORAZEPAM 1 MG PO TABS: ORAL_TABLET | ORAL | 1 refills | Status: AC

## 2023-06-17 MED ORDER — CITALOPRAM HYDROBROMIDE 40 MG PO TABS: 40.0000 mg | ORAL_TABLET | Freq: Every day | ORAL | 3 refills | Status: DC

## 2023-06-17 MED ORDER — PHENTERMINE HCL 37.5 MG PO CAPS: 37.5000 mg | ORAL_CAPSULE | Freq: Every morning | ORAL | 1 refills | Status: DC

## 2023-06-17 NOTE — Addendum Note (Signed)
Addended by: Despina Arias A on: 06/17/2023 03:40 PM   Modules accepted: Level of Service

## 2023-06-17 NOTE — Progress Notes (Addendum)
[facility]  PATIENT: Joanne Parker DOB: [birth year]  REFERRING DOCTOR OR PCP:  Garner Nash  SOURCE: Patient  _________________________________   HISTORICAL  CHIEF COMPLAINT:  Chief Complaint  Patient presents with   Follow-up    Rm 11. Accompanied by mother. Pt reports she is stressed, she is concerned about a relapse of MS. She has been stumbling when walking for the past two weeks.    HISTORY OF PRESENT ILLNESS:  Joanne Parker is a 62 y.o. woman with MS diagnosed in 2011.     UPDATE 06/17/2023 She is on Rebif for MS and denies new exacerbations.    She tolerates it well.   No injection site or systemic s.e.   Last MRi in 2022 showed "T2/FLAIR hyperintense foci in the periventricular, deep and juxtacortical white matter are consistent with her history of MS.  Some of the foci in the pons and basal ganglia could also represent chronic ischemic change.  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 No branch occlusion or other stenosis."   By my read was unchanged to 2021  She has more stress as her disability is being taken away (she is working some).      She notes she is stumbling more than last visit.   She has mildly reduced balance but no falls.   She uses the rails on stairs.   She has mild leg weakness and mild spasticity, worse on her right.     She has mild numbness in the hands and feet at times though the tingling is usually not painful.    Vision is about the same.       She is having urinary urgency and occasional incontinence,    She is on Oxybutynin with mild benefit and has a dry mouth.  Not much benefit with Myrbetriq.    These med's help but she has to wear pads.   She has nocturia x 3.    She reports a lot of fatigue that is physical more than cognitive.  She tried phentermine for weight loss and fatigue/sleep with some benefit.   Phentermine has helped fatigue some and has helped her lose a little weight.    She has  mild OSA and was hoping to avoid CPAP.  We have discussed weight loss and an oral appliance. .    She reports pain in her legs, helped by tramadol/Tylenol. She just takes rarely (couple times a month)  She has had more depression the past month.  She was on Effexor but it was not well tolerated.  She also feels more forgetful  She had a possible visual TIA in October  2022 and we ordered MRI/MRA.   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    There were no acute findings.  She is now taking Lipitor and ASA   PE: She is a well-developed well-nourished woman in no acute distress.  The head is normocephalic and atraumatic.  Sclera are anicteric.  Visible skin appears normal.  The neck has a good range of motion.   She is alert and fully oriented with fluent speech and good attention, knowledge and memory.  Extraocular muscles are intact.  Facial strength is normal.    She appears to have normal strength in the arms.  Rapid alternating movements and finger-nose-finger are performed well.   MS History:    She began to experience difficulties with her gait in December 2010. She felt the onset of gait  disorder was fairly sudden. She noted bilateral leg clumsiness also noted that her handwriting was worse (right-handed). An MRI of the brain was performed on 02/12/2010 showed multiple white matter foci including one enhancing lesion in the left posterior limb of the internal capsule. Lesions were infratentorial and supratentorial with many in the periventricular white matter. Of note, in 2009 she had noted numbness in one of her legs that lasted for 2 or 3 weeks after she was placed on a muscle relaxant. I saw her on 12/28/2009 and felt the MRI was very consistent with MS. she had an exacerbation affecting gait and bladder in 2017.  IMAGING  MRI/MRA 11/20/2021 There were no acute findings.  T2/FLAIR hyperintense foci in the periventricular, deep and juxtacortical white matter are consistent with her history of MS.   Some of the foci in the pons and basal ganglia could also represent chronic ischemic change.  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 No branch occlusion or other stenosis.  MRI brain  [facility]; [place]) 06/08/2020.    MRI brain showed periventricular white matter disease and scattered white matter hyperintensities extending to the subcortical regions bilaterally. These are oriented perpendicular to the ventricles.      MRI cervical spine 06/08/2020 showed a normal spinal cord and multilevel DJD/DDD with protrusions at [facility] and C5C6 with compression of the thecal sac.    MRI brain 12/05/2018 showed Multiple T2/flair hyperintense foci in the hemispheres and pons in a pattern and configuration consistent with chronic demyelinating plaque associated with multiple sclerosis.  None of the foci appears to be acute.  When compared to the MRI dated 07/02/2016, no definite interval change.   REVIEW OF SYSTEMS: Constitutional: No fevers, chills, sweats, or change in appetite.  She notes fatigue. She sleeps well at night. Eyes: No visual changes, double vision, eye pain Ear, nose and throat: No hearing loss, ear pain, nasal congestion, sore throat Cardiovascular: No chest pain, palpitations Respiratory:  No shortness of breath at rest or with exertion.   No wheezes.  She snores GastrointestinaI: No nausea, vomiting, diarrhea, abdominal pain, fecal incontinence Genitourinary:  No dysuria, urinary retention.   Severe urinary frequency with nocturia.  Recent incontinence has been worse. Musculoskeletal:  No neck pain, back pain.   She is having less left knee pain Integumentary: No rash, pruritus, skin lesions Neurological: as above.    Has occipital headaches at times, worse when has busier day.    Psychiatric: Notes depression and anxiety Endocrine: No palpitations, diaphoresis, change in appetite, change in weigh or increased thirst Hematologic/Lymphatic:  No anemia,  purpura, petechiae. Allergic/Immunologic: No itchy/runny eyes, nasal congestion, recent allergic reactions, rashes  ALLERGIES: No Known Allergies  HOME MEDICATIONS:  Current Outpatient Medications:    acetaminophen (TYLENOL) 500 MG tablet, Take 500 mg by mouth every 6 (six) hours as needed. OTC, Disp: , Rfl:    atorvastatin (LIPITOR) 40 MG tablet, Take 1 tablet (40 mg total) by mouth daily., Disp: 90 tablet, Rfl: 3   bisoprolol-hydrochlorothiazide (ZIAC) 10-6.25 MG per tablet, Take 2 tablets by mouth daily., Disp: 60 tablet, Rfl: 5   cholecalciferol (VITAMIN D) 1000 UNITS tablet, Take 5,000 Units by mouth daily. , Disp: , Rfl:    citalopram (CELEXA) 40 MG tablet, Take 1 tablet (40 mg total) by mouth daily., Disp: 90 tablet, Rfl: 3   fluticasone (FLONASE) 50 MCG/ACT nasal spray, , Disp: , Rfl:    Interferon Beta-1a (REBIF REBIDOSE) 44 MCG/0.5ML SOAJ, Use as directed  by physician. Give (0.29mL)as a subcutaneous injection three times per week. PROTECT FROM LIGHT, Disp: 6 mL, Rfl: 11   levothyroxine (SYNTHROID, LEVOTHROID) 25 MCG tablet, Take 25 mcg by mouth daily before breakfast., Disp: , Rfl:    lisinopril (ZESTRIL) 10 MG tablet, Take 1 tablet (10 mg total) by mouth daily., Disp: 1 tablet, Rfl: 0   Multiple Vitamin (MULTIVITAMIN) tablet, Take 1 tablet by mouth daily., Disp: , Rfl:    omeprazole (PRILOSEC) 20 MG capsule, Take 20 mg by mouth daily. , Disp: , Rfl:    oxybutynin (DITROPAN) 5 MG tablet, Take 1 tablet (5 mg total) by mouth 2 (two) times daily., Disp: 180 tablet, Rfl: 3   traMADol (ULTRAM) 50 MG tablet, TAKE 1 TABLET BY MOUTH EVERY 6 HOURS AS NEEDED, Disp: 90 tablet, Rfl: 0   LORazepam (ATIVAN) 1 MG tablet, Tale 1.2 to 1 at night, Disp: 90 tablet, Rfl: 1   phentermine 37.5 MG capsule, Take 1 capsule (37.5 mg total) by mouth every morning., Disp: 90 capsule, Rfl: 1  PAST MEDICAL HISTORY: Past Medical History:  Diagnosis Date   Depression    Headache    Hyperlipidemia     Hypertension    MS (multiple sclerosis) (HCC) 2011   Vision abnormalities     PAST SURGICAL HISTORY: History reviewed. No pertinent surgical history.  FAMILY HISTORY: Family History  Problem Relation Age of Onset   Hypertension Mother    Hypertension Father    Stroke Father    Diabetes Father    Heart disease Maternal Grandmother    Cancer Maternal Grandfather    Heart disease Paternal Grandmother    Asthma Paternal Grandfather     SOCIAL HISTORY:  Social History   Socioeconomic History   Marital status: Single    Spouse name: N/A   Number of children: 0   Years of education: 16   Highest education level: Not on file  Occupational History   Occupation: Armed forces technical officer at a [employer]  Tobacco Use   Smoking status: Never   Smokeless tobacco: Never  Vaping Use   Vaping Use: Never used  Substance and Sexual Activity   Alcohol use: Yes    Alcohol/week: 0.0 standard drinks of alcohol    Comment: occasional   Drug use: No   Sexual activity: Yes    Partners: Male    Birth control/protection: Condom  Other Topics Concern   Not on file  Social History Narrative   Lives alone.   Social Determinants of Health   Financial Resource Strain: Not on file  Food Insecurity: Not on file  Transportation Needs: Not on file  Physical Activity: Not on file  Stress: Not on file  Social Connections: Not on file  Intimate Partner Violence: Not on file     PHYSICAL EXAM  Vitals:   06/17/23 0855  BP: (!) 147/78  Pulse: 62  Weight: 222 lb (100.7 kg)  Height: 5\' 4"  (1.626 m)    Body mass index is 38.11 kg/m.  Repeat blood pressure at 10:30 AM: 185/90  General: The patient is well-developed and well-nourished and in no acute distress    Neurologic Exam  Mental status: The patient is alert and oriented x 3 at the time of the examination. The patient has apparent normal recent and remote memory, with an apparently normal attention span and concentration ability.    Speech is normal.  Cranial nerves: Extraocular movements are full. Facial strength and sensation were fine.  Sternocleidomastoid and  trapezius are strong no obvious hearing deficits are noted.  Motor:  Muscle bulk is normal.   The muscle tone is increased in her legs mildly.  Strength is 5/5 in the arms and legs   Sensory: He has intact sensation to touch temperature and vibration in the arms and legs..  Coordination: Finger-nose-finger is performed well and heel-to-shin is mildly reduced bilaterally.  Gait and station: Station is normal.   Her gait is mildly wide and mildly spastic.  Tandem gait is poor and wide based. Romberg is negative.   Reflexes: Deep tendon reflexes are symmetric and normal in arms and slightly increased at knees.        ASSESSMENT AND PLAN  Multiple sclerosis (HCC)  Anxiety - Plan: LORazepam (ATIVAN) 1 MG tablet  High risk medication use  Other fatigue  Asymptomatic stenosis of intracranial artery  Gait disturbance  Pure hypercholesterolemia  Essential hypertension  Depression, unspecified depression type  Obesity, Class II, BMI 35-39.9   1.  Continue Rebif.  Had labs with PCP and was told they looked fine.   Consider a repeat MRI brain around time of next visit 2.  Continue phentermine 37.5 mg daily for MS related fatigue and weight gain.    Weight loss will also help OSA.  Could also get oral appliance 3.   Start bASA as has intracranial stenosis.  Continue atorvastatin.    4.   Due to impairments (balance, gait, strength/spasticity, urinary dysfunction, fatigue) she is unable to work most jobs, needs multiple breaks for fatigue and bladder and unable to work a full schedule.   6.  Return in 6 months or sooner for new or worsening neurologic symptoms.   This visit is part of a comprehensive longitudinal care medical relationship regarding the patients primary diagnosis of MS and related concerns.   Bevan Disney A. Epimenio Foot, MD, PhD 06/17/2023, 9:22  AM Certified in Neurology, Clinical Neurophysiology, Sleep Medicine, Pain Medicine and Neuroimaging  [facility] [address] [place], [place] [address] [phone]

## 2023-10-08 DIAGNOSIS — Z0271 Encounter for disability determination: Secondary | ICD-10-CM

## 2023-10-10 ENCOUNTER — Ambulatory Visit: Payer: 59 | Admitting: Neurology

## 2023-11-05 ENCOUNTER — Other Ambulatory Visit: Payer: Self-pay | Admitting: Neurology

## 2023-11-05 NOTE — Telephone Encounter (Signed)
Last seen on 06/17/23 Follow up scheduled 01/06/24

## 2023-12-30 ENCOUNTER — Telehealth: Payer: Self-pay | Admitting: *Deleted

## 2023-12-30 ENCOUNTER — Telehealth: Payer: Self-pay

## 2023-12-30 DIAGNOSIS — G35 Multiple sclerosis: Secondary | ICD-10-CM

## 2023-12-30 MED ORDER — REBIF REBIDOSE 44 MCG/0.5ML ~~LOC~~ SOAJ: SUBCUTANEOUS | 0 refills | Status: DC

## 2023-12-30 NOTE — Telephone Encounter (Signed)
 Patient left a vm on main line regarding needing a refill of  Interferon Beta-1a (REBIF REBIDOSE) 44 MCG/0.5ML SOA  Please adivse

## 2023-12-30 NOTE — Telephone Encounter (Signed)
 Last seen on 06/17/23 Follow up scheduled on 01/06/24 Rx sent, patient informed.

## 2023-12-30 NOTE — Telephone Encounter (Signed)
 PA needed for Rebif 44 mcg/0.5 mg SOAJ.

## 2024-01-01 ENCOUNTER — Other Ambulatory Visit (HOSPITAL_COMMUNITY): Payer: Self-pay

## 2024-01-01 NOTE — Telephone Encounter (Signed)
 Pharmacy Patient Advocate Encounter   Received notification from Pt Calls Messages that prior authorization for Rebif  44 mcg/0.5 mg SOAJ  is required/requested.   Insurance verification completed.   The patient is insured through [facility] .   Per test claim:  Betaseron, Glatiramer, Teriflunomide, Dimethyl Fum, Mazment is preferred by the insurance.  If suggested medication is appropriate, Please send in a new RX and discontinue this one. If not, please advise as to why it's not appropriate so that we may request a Prior Authorization. Please note, some preferred medications may still require a PA

## 2024-01-02 NOTE — Telephone Encounter (Addendum)
 Dr.Sater please see the below and advise:  prior authorization for Rebif  44 mcg/0.5 mg   Per test claim:  Betaseron, Glatiramer, Teriflunomide, Dimethyl Fum, Mazment is preferred by the insurance.  If suggested medication is appropriate, Please send in a new RX and discontinue this one. If not, please advise as to why it's not appropriate so that we may request a Prior Authorization. Please note, some preferred medications may still require a PA

## 2024-01-02 NOTE — Telephone Encounter (Signed)
 Per Dr.Sater " Please try to do the prior authorization for Rebif.   She has been on Rebif for many years and her MS has been well-controlled.  Therefore, it is necessary for her to continue on this medication. "

## 2024-01-03 ENCOUNTER — Other Ambulatory Visit (HOSPITAL_COMMUNITY): Payer: Self-pay

## 2024-01-03 NOTE — Telephone Encounter (Signed)
 Pharmacy Patient Advocate Encounter   Received notification from Physician's Office that prior authorization for Rebif  Rebidose 44MCG/0.5ML auto-injectors is required/requested.   Insurance verification completed.   The patient is insured through [facility] .   Per test claim: PA required; PA submitted to above mentioned insurance via CoverMyMeds Key/confirmation #/EOC [id number] Status is pending

## 2024-01-06 ENCOUNTER — Telehealth: Payer: Self-pay | Admitting: Neurology

## 2024-01-06 ENCOUNTER — Ambulatory Visit: Payer: 59 | Admitting: Neurology

## 2024-01-06 NOTE — Telephone Encounter (Signed)
 Joanne Parker from [employer] called [id number] denying Rebiff.

## 2024-01-07 ENCOUNTER — Other Ambulatory Visit (HOSPITAL_COMMUNITY): Payer: Self-pay

## 2024-01-07 NOTE — Telephone Encounter (Signed)
 Pharmacy Patient Advocate Encounter  Received notification from [facility] that Prior Authorization for Rebif  Rebidose 44MCG/0.5ML auto-injectors has been DENIED.  Full denial letter will be uploaded to the media tab. See denial reason below.   PA #/Case ID/Reference #: [id number]

## 2024-01-08 NOTE — Telephone Encounter (Signed)
 Faxed urgent appeal to [phone]. Received fax confirmation. Waiting on determination.

## 2024-01-08 NOTE — Telephone Encounter (Signed)
 She has been on Rebif  since 2011. Tolerating well and stable on therapy. Appeal letter written, pending MD signature

## 2024-01-15 NOTE — Telephone Encounter (Signed)
Called [employer] at [phone]. Spoke w/ Fayrene Fearing. Appeal approved 01/09/24-12/23/24. [id number].

## 2024-01-16 NOTE — Telephone Encounter (Signed)
Pt called wanting to know what is the update on her Interferon Beta-1a (REBIF REBIDOSE) 44 MCG/0.5ML SOAJ.forms. Please advise.

## 2024-01-16 NOTE — Telephone Encounter (Signed)
Called pt. Mailbox full unable to leave message. Please let her know appeal approved for Rebif. Now approved through insurance and she should be able to fill at pharmacy.    Previous note in chart:

## 2024-01-20 NOTE — Telephone Encounter (Addendum)
Tried calling pt again. Mailbox full, unable to leave message.  Sent mychart as well.

## 2024-03-03 ENCOUNTER — Other Ambulatory Visit: Payer: Self-pay | Admitting: Neurology

## 2024-03-03 NOTE — Telephone Encounter (Signed)
 Last seen 06/17/23, 07/20/24  Dispenses   Dispensed Days Supply Quantity Provider Pharmacy  PHENTERMINE 37.5MG   CAP 10/03/2023 30 30 each Sater, Pearletha Furl, MD [facility] [phone] ...    Last note stated: Continue phentermine 37.5 mg daily for MS related fatigue and weight gain.

## 2024-03-16 ENCOUNTER — Other Ambulatory Visit: Payer: Self-pay | Admitting: Neurology

## 2024-03-16 DIAGNOSIS — G35 Multiple sclerosis: Secondary | ICD-10-CM

## 2024-03-16 NOTE — Telephone Encounter (Signed)
 Last seen on 06/17/23 Follow up scheduled on 07/20/24

## 2024-04-15 ENCOUNTER — Other Ambulatory Visit: Payer: Self-pay | Admitting: Neurology

## 2024-04-15 DIAGNOSIS — G35 Multiple sclerosis: Secondary | ICD-10-CM

## 2024-04-15 NOTE — Telephone Encounter (Signed)
 Last seen on 06/17/23 Follow up scheduled on 07/20/24  Rx sent.

## 2024-06-22 ENCOUNTER — Other Ambulatory Visit: Payer: Self-pay | Admitting: Neurology

## 2024-06-22 NOTE — Telephone Encounter (Signed)
 Last seen on 06/17/23 Follow up scheduled on 07/20/24   Dr.Sater your prescribed Rx, but no mentioned on continuing?

## 2024-07-20 ENCOUNTER — Ambulatory Visit (INDEPENDENT_AMBULATORY_CARE_PROVIDER_SITE_OTHER): Payer: 59 | Admitting: Neurology

## 2024-07-20 ENCOUNTER — Encounter: Payer: Self-pay | Admitting: Neurology

## 2024-07-20 VITALS — BP 140/90 | HR 76 | Ht 64.0 in | Wt 216.5 lb

## 2024-07-20 DIAGNOSIS — Z79899 Other long term (current) drug therapy: Secondary | ICD-10-CM

## 2024-07-20 DIAGNOSIS — R269 Unspecified abnormalities of gait and mobility: Secondary | ICD-10-CM | POA: Diagnosis not present

## 2024-07-20 DIAGNOSIS — R5383 Other fatigue: Secondary | ICD-10-CM

## 2024-07-20 DIAGNOSIS — G35 Multiple sclerosis: Secondary | ICD-10-CM | POA: Diagnosis not present

## 2024-07-20 DIAGNOSIS — F32A Depression, unspecified: Secondary | ICD-10-CM

## 2024-07-20 MED ORDER — SOLIFENACIN SUCCINATE 10 MG PO TABS: 10.0000 mg | ORAL_TABLET | Freq: Every day | ORAL | 3 refills | Status: AC

## 2024-07-20 NOTE — Progress Notes (Signed)
 [facility]  PATIENT: Joanne Parker DOB: 05/12/1961  REFERRING DOCTOR OR PCP:  Dorn Drown  SOURCE: Patient  _________________________________   HISTORICAL  CHIEF COMPLAINT:  Chief Complaint  Patient presents with   Follow-up    Pt in room 10. Mother in room.Here for MS follow up. On Rebif .. patient reports doing well, no falls.     HISTORY OF PRESENT ILLNESS:  Joanne Parker is a 63 y.o. woman with MS diagnosed in 2011.     UPDATE 07/20/2024 She is on Rebif  for MS and denies new exacerbations.    She feels stable with no exacerbations or new symptoms.  She tolerates it well.   No injection site or systemic s.e.     Last MRi in 2022 showed T2/FLAIR hyperintense foci in the periventricular, deep and juxtacortical white matter are consistent with her history of MS.  Some of the foci in the pons and basal ganglia could also represent chronic ischemic change.  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 No branch occlusion or other stenosis.   By my read was unchanged to 2021.   She stopped the aspirn and we discussed restarting.   Gait is mildly off balanced but she denies any falls.   She uses the rails on stairs.   She has mild leg weakness and mild spasticity, worse on her right.   She denies hand weakness  She has mild numbness in her feet feet at times though the tingling is usually not painful.    Vision is about the same.      She is having urinary urgency and occasional incontinence,    She is on Oxybutynin  with mild benefit and has a dry mouth.  Not much benefit with Myrbetriq  so she stopped.  Oxybutynin  only helps a bit so she wears Depends.   She has nocturia x 3.   She has CRF with creatinine = 2.55 02/2054  She reports a lot of fatigue that is physical more than cognitive.  She tried phentermine  for weight loss and fatigue/sleep with some benefit.   Phentermine  has helped fatigue some and has helped her lose a little weight.     She has mild OSA and was hoping to avoid CPAP.  In the past, we had discussed weight loss and an oral appliance. .    She reports pain in her legs, helped by tramadol /Tylenol. She just takes rarely (couple times a month)  She has had more depression the past month.  She was on Effexor  but it was not well tolerated.  She is now on citalopram  but it has not helped her much   She also feels more forgetful  She had a possible visual TIA in October  2022 and we ordered MRI/MRA.   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    There were no acute findings.  She is now taking Lipitor and ASA   PE: She is a well-developed well-nourished woman in no acute distress.  The head is normocephalic and atraumatic.  Sclera are anicteric.  Visible skin appears normal.  The neck has a good range of motion.   She is alert and fully oriented with fluent speech and good attention, knowledge and memory.  Extraocular muscles are intact.  Facial strength is normal.    She appears to have normal strength in the arms.  Rapid alternating movements and finger-nose-finger are performed well.   MS History:    She began to experience difficulties  with her gait in December 2010. She felt the onset of gait disorder was fairly sudden. She noted bilateral leg clumsiness also noted that her handwriting was worse (right-handed). An MRI of the brain was performed on 02/12/2010 showed multiple white matter foci including one enhancing lesion in the left posterior limb of the internal capsule. Lesions were infratentorial and supratentorial with many in the periventricular white matter. Of note, in 2009 she had noted numbness in one of her legs that lasted for 2 or 3 weeks after she was placed on a muscle relaxant. I saw her on 12/28/2009 and felt the MRI was very consistent with MS. she had an exacerbation affecting gait and bladder in 2017.  IMAGING  MRI/MRA 11/20/2021 There were no acute findings.  T2/FLAIR hyperintense foci in the  periventricular, deep and juxtacortical white matter are consistent with her history of MS.  Some of the foci in the pons and basal ganglia could also represent chronic ischemic change.  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 No branch occlusion or other stenosis.  MRI brain  [facility]; [place]) 06/08/2020.    MRI brain showed periventricular white matter disease and scattered white matter hyperintensities extending to the subcortical regions bilaterally. These are oriented perpendicular to the ventricles.      MRI cervical spine 06/08/2020 showed a normal spinal cord and multilevel DJD/DDD with protrusions at [facility] and C5C6 with compression of the thecal sac.    MRI brain 12/05/2018 showed Multiple T2/flair hyperintense foci in the hemispheres and pons in a pattern and configuration consistent with chronic demyelinating plaque associated with multiple sclerosis.  None of the foci appears to be acute.  When compared to the MRI dated 07/02/2016, no definite interval change.   REVIEW OF SYSTEMS: Constitutional: No fevers, chills, sweats, or change in appetite.  She notes fatigue. She sleeps well at night. Eyes: No visual changes, double vision, eye pain Ear, nose and throat: No hearing loss, ear pain, nasal congestion, sore throat Cardiovascular: No chest pain, palpitations Respiratory:  No shortness of breath at rest or with exertion.   No wheezes.  She snores GastrointestinaI: No nausea, vomiting, diarrhea, abdominal pain, fecal incontinence Genitourinary:  No dysuria, urinary retention.   Severe urinary frequency with nocturia.  Recent incontinence has been worse. Musculoskeletal:  No neck pain, back pain.   She is having less left knee pain Integumentary: No rash, pruritus, skin lesions Neurological: as above.    Has occipital headaches at times, worse when has busier day.    Psychiatric: Notes depression and anxiety Endocrine: No palpitations, diaphoresis,  change in appetite, change in weigh or increased thirst Hematologic/Lymphatic:  No anemia, purpura, petechiae. Allergic/Immunologic: No itchy/runny eyes, nasal congestion, recent allergic reactions, rashes  ALLERGIES: No Known Allergies  HOME MEDICATIONS:  Current Outpatient Medications:    acetaminophen (TYLENOL) 500 MG tablet, Take 500 mg by mouth every 6 (six) hours as needed. OTC, Disp: , Rfl:    atorvastatin  (LIPITOR) 40 MG tablet, Take 1 tablet (40 mg total) by mouth daily., Disp: 90 tablet, Rfl: 3   bisoprolol -hydrochlorothiazide  (ZIAC ) 10-6.25 MG per tablet, Take 2 tablets by mouth daily., Disp: 60 tablet, Rfl: 5   citalopram  (CELEXA ) 40 MG tablet, Take 1 tablet by mouth once daily, Disp: 90 tablet, Rfl: 0   fluticasone (FLONASE) 50 MCG/ACT nasal spray, , Disp: , Rfl:    levothyroxine (SYNTHROID, LEVOTHROID) 25 MCG tablet, Take 25 mcg by mouth daily before breakfast., Disp: , Rfl:  lisinopril  (ZESTRIL ) 10 MG tablet, Take 1 tablet (10 mg total) by mouth daily., Disp: 1 tablet, Rfl: 0   LORazepam  (ATIVAN ) 1 MG tablet, Tale 1.2 to 1 at night, Disp: 90 tablet, Rfl: 1   Multiple Vitamin (MULTIVITAMIN) tablet, Take 1 tablet by mouth daily., Disp: , Rfl:    omeprazole (PRILOSEC) 20 MG capsule, Take 20 mg by mouth daily. , Disp: , Rfl:    oxybutynin  (DITROPAN ) 5 MG tablet, TAKE 1 TABLET(5 MG) BY MOUTH TWICE DAILY, Disp: 180 tablet, Rfl: 1   phentermine  37.5 MG capsule, Take 1 capsule by mouth in the morning, Disp: 30 capsule, Rfl: 5   REBIF  REBIDOSE 44 MCG/0.5ML SOAJ, GIVE 44 MCG (0.5ML) SUBCUTANEOUSLY THREE TIMES A WEEK, Disp: 6 mL, Rfl: 3   traMADol  (ULTRAM ) 50 MG tablet, TAKE 1 TABLET BY MOUTH EVERY 6 HOURS AS NEEDED, Disp: 90 tablet, Rfl: 0   cholecalciferol (VITAMIN D) 1000 UNITS tablet, Take 5,000 Units by mouth daily.  (Patient not taking: Reported on 07/20/2024), Disp: , Rfl:   PAST MEDICAL HISTORY: Past Medical History:  Diagnosis Date   Depression    Headache     Hyperlipidemia    Hypertension    Kidney disease    2025   MS (multiple sclerosis) (HCC) 12/24/2009   Vision abnormalities     PAST SURGICAL HISTORY: History reviewed. No pertinent surgical history.  FAMILY HISTORY: Family History  Problem Relation Age of Onset   Hypertension Mother    Hypertension Father    Stroke Father    Diabetes Father    Heart disease Maternal Grandmother    Cancer Maternal Grandfather    Heart disease Paternal Grandmother    Asthma Paternal Grandfather     SOCIAL HISTORY:  Social History   Socioeconomic History   Marital status: Single    Spouse name: N/A   Number of children: 0   Years of education: 16   Highest education level: Not on file  Occupational History   Occupation: Armed forces technical officer at a [employer]  Tobacco Use   Smoking status: Never   Smokeless tobacco: Never  Vaping Use   Vaping status: Never Used  Substance and Sexual Activity   Alcohol use: Not Currently    Comment: occasional   Drug use: No   Sexual activity: Yes    Partners: Male    Birth control/protection: Condom  Other Topics Concern   Not on file  Social History Narrative   Lives alone.   Social Drivers of Corporate investment banker Strain: Not on file  Food Insecurity: Not on file  Transportation Needs: Not on file  Physical Activity: Not on file  Stress: Not on file  Social Connections: Not on file  Intimate Partner Violence: Not on file     PHYSICAL EXAM  Vitals:   07/20/24 1057 07/20/24 1101  BP: (!) 167/82 (!) 140/90  Pulse: 76   Weight: 216 lb 8 oz (98.2 kg)   Height: 5' 4 (1.626 m)     Body mass index is 37.16 kg/m.  Repeat blood pressure at 10:30 AM: 185/90  General: The patient is well-developed and well-nourished and in no acute distress    Neurologic Exam  Mental status: The patient is alert and oriented x 3 at the time of the examination. The patient has apparent normal recent and remote memory, with an apparently  normal attention span and concentration ability.   Speech is normal.  Cranial nerves: Extraocular movements are full. Facial strength  and sensation were fine.  Sternocleidomastoid and trapezius are strong no obvious hearing deficits are noted.  Motor:  Muscle bulk is normal.  There is mild muscle tone increase in the legs.  Strength is 5/5 in the arms and legs   Sensory: He has intact sensation to touch temperature and vibration in the arms and legs..  Coordination: Finger-nose-finger is performed well and heel-to-shin is mildly reduced bilaterally.  Gait and station: Station is normal.   Her gait is mildly wide and mildly spastic.  Tandem gait is poor and wide based. Romberg is negative.   Reflexes: Deep tendon reflexes are symmetric and normal in arms and slightly increased at knees.        ASSESSMENT AND PLAN  No diagnosis found.   1.  Continue Rebif . LFT and CBC/D were ok in March 2025.  Around the time of the next visit consider an MRI of the brain  2.  Continue phentermine  37.5 mg daily for MS related fatigue and weight gain.    Weight loss will also help OSA.  Could also get oral appliance 3.   Start bASA as has intracranial stenosis.  Continue atorvastatin .    4.   Due to impairments (balance, gait, strength/spasticity, urinary dysfunction, fatigue) she is unable to work most jobs, needs multiple breaks for fatigue and bladder and unable to work a full schedule.   5.  Change oxybutynin  to solifenacin  as benefit is suboptimal   Has tried Myrbetriq  wothout additional benefit. 6.  Return in 6 months or sooner for new or worsening neurologic symptoms.   This visit is part of a comprehensive longitudinal care medical relationship regarding the patients primary diagnosis of MS and related concerns.   Howard Patton A. Vear, MD, PhD 07/20/2024, 11:21 AM Certified in Neurology, Clinical Neurophysiology, Sleep Medicine, Pain Medicine and Neuroimaging  [facility] [address], [address] [place], [place] [address] [phone]

## 2024-07-30 ENCOUNTER — Other Ambulatory Visit: Payer: Self-pay

## 2024-07-30 ENCOUNTER — Other Ambulatory Visit: Payer: Self-pay | Admitting: Neurology

## 2024-07-30 DIAGNOSIS — G35 Multiple sclerosis: Secondary | ICD-10-CM

## 2024-10-01 ENCOUNTER — Other Ambulatory Visit: Payer: Self-pay | Admitting: Neurology

## 2024-10-01 NOTE — Telephone Encounter (Signed)
 Last seen on 07/20/24 Follow up scheduled on 02/18/25   Dispensed Days Supply Quantity Provider Pharmacy  PHENTERMINE  37.5MG   CAP 08/27/2024 30 30 each Sater, Charlie LABOR, MD [facility] [phone] ...     Rx pending to be signed

## 2024-10-01 NOTE — Telephone Encounter (Signed)
 Last seen on 07/20/24 per note  Change oxybutynin  to solifenacin  as benefit is suboptimal Has tried Myrbetriq  wothout additional benefit.    Follow up scheduled on 02/18/25   Rx denied

## 2024-10-29 ENCOUNTER — Telehealth: Payer: Self-pay | Admitting: Neurology

## 2024-10-29 MED ORDER — METHYLPREDNISOLONE 4 MG PO TBPK
ORAL_TABLET | ORAL | 0 refills | Status: AC
Start: 2024-10-29 — End: ?

## 2024-10-29 NOTE — Telephone Encounter (Signed)
 Called and spoke with pt. Relayed Dr. Duncan recommendation. She will take 6 tablets on day one and decrease by one tablet each day until finished. Aware to call back if ineffective to schedule appt.
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
## 2025-02-18 ENCOUNTER — Telehealth: Admitting: Neurology
